# Patient Record
Sex: Female | Born: 1970
Health system: Southern US, Community
[De-identification: ages and names within clinical notes are randomized; demographics above are authoritative.]

## PROBLEM LIST (undated history)

## (undated) DIAGNOSIS — T7840XA Allergy, unspecified, initial encounter: Secondary | ICD-10-CM

## (undated) HISTORY — DX: Allergy, unspecified, initial encounter: T78.40XA

---

## 2001-05-29 ENCOUNTER — Ambulatory Visit (HOSPITAL_BASED_OUTPATIENT_CLINIC_OR_DEPARTMENT_OTHER): Admission: RE | Admit: 2001-05-29 | Discharge: 2001-05-29 | Payer: Self-pay | Admitting: Plastic Surgery

## 2002-03-14 ENCOUNTER — Ambulatory Visit (HOSPITAL_BASED_OUTPATIENT_CLINIC_OR_DEPARTMENT_OTHER): Admission: RE | Admit: 2002-03-14 | Discharge: 2002-03-14 | Payer: Self-pay | Admitting: Plastic Surgery

## 2004-05-11 ENCOUNTER — Encounter: Admission: RE | Admit: 2004-05-11 | Discharge: 2004-05-11 | Payer: Self-pay | Admitting: Family Medicine

## 2006-03-03 ENCOUNTER — Inpatient Hospital Stay (HOSPITAL_COMMUNITY): Admission: AD | Admit: 2006-03-03 | Discharge: 2006-03-07 | Payer: Self-pay | Admitting: Obstetrics and Gynecology

## 2006-03-08 ENCOUNTER — Encounter: Admission: RE | Admit: 2006-03-08 | Discharge: 2006-04-07 | Payer: Self-pay | Admitting: Obstetrics and Gynecology

## 2006-04-08 ENCOUNTER — Encounter: Admission: RE | Admit: 2006-04-08 | Discharge: 2006-05-07 | Payer: Self-pay | Admitting: Obstetrics and Gynecology

## 2006-05-08 ENCOUNTER — Encounter: Admission: RE | Admit: 2006-05-08 | Discharge: 2006-06-07 | Payer: Self-pay | Admitting: Obstetrics and Gynecology

## 2006-06-08 ENCOUNTER — Encounter: Admission: RE | Admit: 2006-06-08 | Discharge: 2006-07-08 | Payer: Self-pay | Admitting: Obstetrics and Gynecology

## 2006-07-09 ENCOUNTER — Encounter: Admission: RE | Admit: 2006-07-09 | Discharge: 2006-08-07 | Payer: Self-pay | Admitting: Obstetrics and Gynecology

## 2006-08-08 ENCOUNTER — Encounter: Admission: RE | Admit: 2006-08-08 | Discharge: 2006-09-07 | Payer: Self-pay | Admitting: Obstetrics and Gynecology

## 2006-09-08 ENCOUNTER — Encounter: Admission: RE | Admit: 2006-09-08 | Discharge: 2006-10-07 | Payer: Self-pay | Admitting: Obstetrics and Gynecology

## 2006-10-08 ENCOUNTER — Encounter: Admission: RE | Admit: 2006-10-08 | Discharge: 2006-11-07 | Payer: Self-pay | Admitting: Obstetrics and Gynecology

## 2006-11-08 ENCOUNTER — Encounter: Admission: RE | Admit: 2006-11-08 | Discharge: 2006-12-08 | Payer: Self-pay | Admitting: Obstetrics and Gynecology

## 2006-12-09 ENCOUNTER — Encounter: Admission: RE | Admit: 2006-12-09 | Discharge: 2006-12-21 | Payer: Self-pay | Admitting: Obstetrics and Gynecology

## 2009-10-30 DIAGNOSIS — J309 Allergic rhinitis, unspecified: Secondary | ICD-10-CM | POA: Insufficient documentation

## 2014-09-03 ENCOUNTER — Ambulatory Visit (INDEPENDENT_AMBULATORY_CARE_PROVIDER_SITE_OTHER): Payer: BC Managed Care – PPO | Admitting: Sports Medicine

## 2014-09-03 ENCOUNTER — Encounter: Payer: Self-pay | Admitting: Sports Medicine

## 2014-09-03 VITALS — BP 110/74 | Ht 66.5 in | Wt 140.0 lb

## 2014-09-03 DIAGNOSIS — M25561 Pain in right knee: Secondary | ICD-10-CM | POA: Diagnosis not present

## 2014-09-03 DIAGNOSIS — M25361 Other instability, right knee: Secondary | ICD-10-CM

## 2014-09-03 NOTE — Progress Notes (Signed)
  Brittney Baird - 43 y.o. female MRN [id number]  Date of birth: 20-Nov-1970  CC & HPI:  Brittney Baird presents for evaluation of: Right knee pain: patient reports medial sided knee pain and symptoms of instability that of been present for the past approximate 8 weeks. She reports playing tennis and feeling her knee give way. This is happening multiple times and was witnessed 1 time by a friend who reported appearing as though she had a hyperextension injury. She's had persistent medial sided knee pain and denies clicking, locking. She has had no significant prior trauma to this knee no prior surgeries. She reports overall strength is good but with activity especially with any type of valgus stress, such as with a lateral shuttle run she reports feeling is that the knee will give way. She is taking occasional NSAIDs, using a knee brace and is otherwise very active but no formal TherEx.she denies any significant effusion does report there are some stiffness following the initial incident.  ROS:  Per HPI.   HISTORY: Past Medical, Surgical, Social, and Family History Reviewed & Updated per EMR.  Pertinent Historical Findings include: Seasonal allergies otherwise healthy   OBJECTIVE:  VS:   HT:5' 6.5" (168.9 cm)   WT:140 lb (63.504 kg)  BMI:22.3          BP:110/74 mmHg  HR: bpm  TEMP: ( )  RESP:   PHYSICAL EXAM: GENERAL: Adult Caucasian  female. In no discomfort; no respiratory distress   PSYCH: alert and appropriate, good insight   NEURO: sensation is intact to light touch in bilateral lower extremities  VASCULAR: DP and PT pulses 2+/4.  No significant edema.    Right knee EXAM: Appearance: Overall normal-appearing, no significant effusion  Skin: No overlying erythema/ecchymosis.  Palpation: TTP over: posterior medial aspect of an joint line over the insertion of the hamstring  No TTP over: patellar tendon, medial or lateral joint lines  Strength & ROM: 5/5 Strength and full active ROM in: knee  flexion and extension. She does have slight amount of pain with resisted hamstring flexion at 30 knee bend over the medial aspect. Weakness in: right hip abduction and adduction at 5-/5on the right with a TFL predominant hip abduction recruitment pattern; 5+/5 on the left  Special Tests: Bilateral knees have excessive anterior translation with anterior drawer and Lachman's however there is a good endpoint on the left. No solid endpoint on the right. She is stable to varus strain, approximately 15 motion with valgus strain bilaterally with good endpoint, normal, nonpainful McMurray's, negative Apley's compression test.    ASSESSMENT: 1. Right knee pain   2. Instability of right knee joint    Given the mechanism of her injury, the location of her pain and symptoms of significant instability combined with a lack of a solid endpoint with Lachman's I have concerns for an ACL insufficient knee.  PLAN: See problem based charting & AVS for additional documentation. - X-ray to eval bony anatomy - MRI to eval for ACL and posterior medial meniscal pathology > I'll plan to call her after the MRI to discuss options at that time including potential surgical intervention versus conservative therapy with physical therapy if MRI is benign.

## 2014-09-04 ENCOUNTER — Ambulatory Visit
Admission: RE | Admit: 2014-09-04 | Discharge: 2014-09-04 | Disposition: A | Discharge status: Home / Self Care | Payer: BC Managed Care – PPO | Source: Ambulatory Visit | Attending: Sports Medicine | Admitting: Sports Medicine

## 2014-09-04 DIAGNOSIS — M25561 Pain in right knee: Secondary | ICD-10-CM

## 2014-09-04 DIAGNOSIS — S83519A Sprain of anterior cruciate ligament of unspecified knee, initial encounter: Secondary | ICD-10-CM | POA: Insufficient documentation

## 2014-09-04 NOTE — Patient Instructions (Signed)
Prior auth for MRI is [id number]

## 2014-09-11 ENCOUNTER — Ambulatory Visit
Admission: RE | Admit: 2014-09-11 | Discharge: 2014-09-11 | Disposition: A | Discharge status: Home / Self Care | Payer: BC Managed Care – PPO | Source: Ambulatory Visit | Attending: Sports Medicine | Admitting: Sports Medicine

## 2014-09-11 DIAGNOSIS — M25561 Pain in right knee: Secondary | ICD-10-CM

## 2014-09-12 ENCOUNTER — Telehealth: Payer: Self-pay | Admitting: Sports Medicine

## 2014-09-12 DIAGNOSIS — S83511D Sprain of anterior cruciate ligament of right knee, subsequent encounter: Secondary | ICD-10-CM

## 2014-09-12 NOTE — Assessment & Plan Note (Signed)
Called and discussed results of MRI with the patient regarding anterior cruciate ligament tear. Patient would like to discuss options with her husband but is leaning towards reconstruction. She will try to call and schedule appointment herself and inform us of who she would like to see. If she would like for [place] to put in a referral I discussed having her see Dr. Alphonzo Severance at [place] but we are happy to send her paperwork and information to wherever she would prefer.

## 2014-09-12 NOTE — Telephone Encounter (Signed)
See problem note

## 2014-09-17 ENCOUNTER — Ambulatory Visit: Payer: BC Managed Care – PPO | Admitting: Sports Medicine

## 2014-10-08 ENCOUNTER — Ambulatory Visit: Payer: BC Managed Care – PPO | Admitting: Sports Medicine

## 2015-04-23 ENCOUNTER — Ambulatory Visit (INDEPENDENT_AMBULATORY_CARE_PROVIDER_SITE_OTHER): Payer: BLUE CROSS/BLUE SHIELD | Admitting: Physician Assistant

## 2015-04-23 VITALS — BP 104/60 | HR 57 | Temp 98.2°F | Resp 20 | Ht 67.0 in | Wt 152.0 lb

## 2015-04-23 DIAGNOSIS — H9202 Otalgia, left ear: Secondary | ICD-10-CM

## 2015-04-23 DIAGNOSIS — M2662 Arthralgia of temporomandibular joint: Secondary | ICD-10-CM | POA: Diagnosis not present

## 2015-04-23 DIAGNOSIS — H6593 Unspecified nonsuppurative otitis media, bilateral: Secondary | ICD-10-CM

## 2015-04-23 DIAGNOSIS — M26629 Arthralgia of temporomandibular joint, unspecified side: Secondary | ICD-10-CM

## 2015-04-23 NOTE — Patient Instructions (Signed)
Switch to zyrtec-D to see if that helps. Take ibuprofen 800 mg three times a day for next 3-4 days. Eat softer foods. Ice your jaw a few times a day. If your symptoms are not improving in 2-3 weeks, return.  Temporomandibular Problems  Temporomandibular joint (TMJ) dysfunction means there are problems with the joint between your jaw and your skull. This is a joint lined by cartilage like other joints in your body but also has a small disc in the joint which keeps the bones from rubbing on each other. These joints are like other joints and can get inflamed (sore) from arthritis and other problems. When this joint gets sore, it can cause headaches and pain in the jaw and the face. CAUSES  Usually the arthritic types of problems are caused by soreness in the joint. Soreness in the joint can also be caused by overuse. This may come from grinding your teeth. It may also come from mis-alignment in the joint. DIAGNOSIS Diagnosis of this condition can often be made by history and exam. Sometimes your caregiver may need X-rays or an MRI scan to determine the exact cause. It may be necessary to see your dentist to determine if your teeth and jaws are lined up correctly. TREATMENT  Most of the time this problem is not serious; however, sometimes it can persist (become chronic). When this happens medications that will cut down on inflammation (soreness) help. Sometimes a shot of cortisone into the joint will be helpful. If your teeth are not aligned it may help for your dentist to make a splint for your mouth that can help this problem. If no physical problems can be found, the problem may come from tension. If tension is found to be the cause, biofeedback or relaxation techniques may be helpful. HOME CARE INSTRUCTIONS   Later in the day, applications of ice packs may be helpful. Ice can be used in a plastic bag with a towel around it to prevent frostbite to skin. This may be used about every 2 hours for 20 to  30 minutes, as needed while awake, or as directed by your caregiver.  Only take over-the-counter or prescription medicines for pain, discomfort, or fever as directed by your caregiver.  If physical therapy was prescribed, follow your caregiver's directions.  Wear mouth appliances as directed if they were given. Document Released: 07/05/2001 Document Revised: 01/02/2012 Document Reviewed: 10/12/2008 [facility] Patient Information 2015 [place], [place]. This information is not intended to replace advice given to you by your health care provider. Make sure you discuss any questions you have with your health care provider.

## 2015-04-23 NOTE — Progress Notes (Signed)
[facility] [address] [place] [address] [phone]  Date:  04/23/2015   Name:  Brittney Baird   DOB:  Dec 16, 1970   MRN:  [id number]  PCP:  No primary care provider on file.    Chief Complaint: Ear Pain   History of Present Illness:  This is a 44 y.o. female with PMH allergic rhinitis who is presenting with left ear pain x 1 day. States her hearing feels mildly muffled. Pain described as a dull ache. Tender with pressing in front of her ear and pulling on her ear. Some pain with chewing. No drainage from the ear. Hasn't been swimming recently. States she cannot recall what is feels like to get an ear infection as it has been quite some time since last infection. She has year round allergic rhinitis which she takes allegra for. She will occasionally take zyrtec-D if she is having sinus problems. She states she woke with a bad sinus headache 2 nights ago but otherwise no problems. Denies sore throat, nasal congestion, cough, fever, chills. Pt does not have a history of TMJ dysfunction. No popping or clicking with opening mouth.  Review of Systems:  Review of Systems See HPI  Patient Active Problem List   Diagnosis Date Noted  . Anterior cruciate ligament tear 09/04/2014  . Allergic rhinitis 10/30/2009    Prior to Admission medications   Medication Sig Start Date End Date Taking? Authorizing Provider  cetirizine-pseudoephedrine (ZYRTEC-D) 5-120 MG per tablet Take by mouth.   Yes Historical Provider, MD  fexofenadine (ALLEGRA) 180 MG tablet Take 180 mg by mouth.   Yes Historical Provider, MD  fluticasone (FLONASE) 50 MCG/ACT nasal spray USE 2 SPRAYS IN EACH NOSTRIL AT BEDTIME. 02/04/13  Yes Historical Provider, MD  norethindrone-ethinyl estradiol (JUNEL FE,GILDESS FE,LOESTRIN FE) 1-20 MG-MCG tablet Take 1 tablet by mouth.   Yes Historical Provider, MD    Allergies  Allergen Reactions  . Codeine     nausea    Past Surgical History  Procedure  Laterality Date  . Cesarean section      History  Substance Use Topics  . Smoking status: Never Smoker   . Smokeless tobacco: Never Used  . Alcohol Use: No    Family History  Problem Relation Age of Onset  . Cancer Mother   . Diabetes Mother   . Hyperlipidemia Mother   . Cancer Father     Medication list has been reviewed and updated.  Physical Examination:  Physical Exam  Constitutional: She is oriented to person, place, and time. She appears well-developed and well-nourished. No distress.  HENT:  Head: Normocephalic and atraumatic.  Right Ear: Hearing, external ear and ear canal normal. A middle ear effusion is present.  Left Ear: Hearing, external ear and ear canal normal. A middle ear effusion is present.  Nose: Nose normal.  Mouth/Throat: Uvula is midline and mucous membranes are normal. Posterior oropharyngeal erythema (mild) present. No oropharyngeal exudate or posterior oropharyngeal edema.  No mastoid tenderness Tender in temporomandibular joint. No popping/clicking palpated. No pain with manipulation of auricle or tragus  Eyes: Conjunctivae and lids are normal. Right eye exhibits no discharge. Left eye exhibits no discharge. No scleral icterus.  Cardiovascular: Normal rate, regular rhythm and normal pulses.   Pulmonary/Chest: Effort normal and breath sounds normal. No respiratory distress.  Musculoskeletal: Normal range of motion.  Lymphadenopathy:       Head (right side): No submental, no submandibular, no tonsillar, no preauricular and no  posterior auricular adenopathy present.       Head (left side): No submental, no submandibular, no tonsillar, no preauricular and no posterior auricular adenopathy present.    She has no cervical adenopathy.  Neurological: She is alert and oriented to person, place, and time.  Skin: Skin is warm, dry and intact. No lesion and no rash noted.  Psychiatric: She has a normal mood and affect. Her speech is normal and behavior is  normal. Thought content normal.   BP 104/60 mmHg  Pulse 57  Temp(Src) 98.2 F (36.8 C) (Oral)  Resp 20  Ht 5\' 7"  (1.702 m)  Wt 152 lb (68.947 kg)  BMI 23.80 kg/m2  SpO2 98%  LMP 04/06/2015 (Approximate)  Assessment and Plan:  1. Middle ear effusion, bilateral 2. Otalgia, left 3. Temporomandibular joint (TMJ) pain Pain d/t TMJ dysfunction vs eustachian tube dysfunction. She has bilateral middle ear effusions. Tender over TMJ and has been having pain with chewing. Advised she switch to zyrtec-D instead of allergra to help with middle ear effusion. Ibuprofen 800 mg TID for pain. Apply ice to jaw. Eat a softer diet until pain resolves. Return in 2-3 weeks if symptoms are not improving.   Benjaman Pott Drenda Freeze, MHS [facility]  04/23/2015

## 2016-08-10 DIAGNOSIS — L814 Other melanin hyperpigmentation: Secondary | ICD-10-CM | POA: Diagnosis not present

## 2016-08-10 DIAGNOSIS — Z86018 Personal history of other benign neoplasm: Secondary | ICD-10-CM | POA: Diagnosis not present

## 2016-08-10 DIAGNOSIS — D1800 Hemangioma unspecified site: Secondary | ICD-10-CM | POA: Diagnosis not present

## 2016-08-10 DIAGNOSIS — Z23 Encounter for immunization: Secondary | ICD-10-CM | POA: Diagnosis not present

## 2016-08-10 DIAGNOSIS — D225 Melanocytic nevi of trunk: Secondary | ICD-10-CM | POA: Diagnosis not present

## 2016-12-07 DIAGNOSIS — Z1231 Encounter for screening mammogram for malignant neoplasm of breast: Secondary | ICD-10-CM | POA: Diagnosis not present

## 2016-12-07 DIAGNOSIS — Z01419 Encounter for gynecological examination (general) (routine) without abnormal findings: Secondary | ICD-10-CM | POA: Diagnosis not present

## 2016-12-07 DIAGNOSIS — Z6823 Body mass index (BMI) 23.0-23.9, adult: Secondary | ICD-10-CM | POA: Diagnosis not present

## 2017-04-19 DIAGNOSIS — H5213 Myopia, bilateral: Secondary | ICD-10-CM | POA: Diagnosis not present

## 2017-04-19 DIAGNOSIS — H524 Presbyopia: Secondary | ICD-10-CM | POA: Diagnosis not present

## 2017-04-19 DIAGNOSIS — H52202 Unspecified astigmatism, left eye: Secondary | ICD-10-CM | POA: Diagnosis not present

## 2017-05-23 DIAGNOSIS — Z800 Family history of malignant neoplasm of digestive organs: Secondary | ICD-10-CM | POA: Diagnosis not present

## 2017-05-26 ENCOUNTER — Ambulatory Visit (INDEPENDENT_AMBULATORY_CARE_PROVIDER_SITE_OTHER): Payer: BLUE CROSS/BLUE SHIELD | Admitting: Gastroenterology

## 2017-05-26 ENCOUNTER — Encounter: Payer: Self-pay | Admitting: Gastroenterology

## 2017-05-26 VITALS — BP 90/60 | HR 68 | Ht 66.0 in | Wt 147.0 lb

## 2017-05-26 DIAGNOSIS — Z800 Family history of malignant neoplasm of digestive organs: Secondary | ICD-10-CM | POA: Diagnosis not present

## 2017-05-26 MED ORDER — NA SULFATE-K SULFATE-MG SULF 17.5-3.13-1.6 GM/177ML PO SOLN: 1.0000 | Freq: Once | ORAL | 0 refills | Status: AC

## 2017-05-26 NOTE — Progress Notes (Signed)
HPI: This is a   very pleasant 46 year old woman whom I am meeting for the first time today  who was referred to me by Fanny Bien, MD  to evaluate  family history colon cancer .    Chief complaint is family history of colon cancer  Her mother had colon cancer at age 68.  Surgery only. Survived and is alive today but in generally poor health.  No other FH of colon cancer.  She has minor constipation, until pregnancy;  Takes flaxseed  And on that regimen she has pretty irregular bowel habits  Very rare minor rectal bleeding.  Overall stable weight.     Review of systems: Pertinent positive and negative review of systems were noted in the above HPI section. All other review negative.   Past Medical History:  Diagnosis Date  . Allergy     Past Surgical History:  Procedure Laterality Date  . CESAREAN SECTION      Current Outpatient Prescriptions  Medication Sig Dispense Refill  . fexofenadine (ALLEGRA) 180 MG tablet Take 180 mg by mouth.    . Flaxseed, Linseed, (FLAX SEED OIL PO) Take 1 tablet by mouth daily.    . Misc Natural Products (GLUCOSAMINE CHOND MSM FORMULA PO) Take 1 tablet by mouth daily.    . Multiple Vitamins-Minerals (WOMENS MULTIVITAMIN PO) Take 1 tablet by mouth daily.    . norethindrone-ethinyl estradiol (JUNEL FE,GILDESS FE,LOESTRIN FE) 1-20 MG-MCG tablet Take 1 tablet by mouth.    . TURMERIC PO Take 1 tablet by mouth daily.     No current facility-administered medications for this visit.     Allergies as of 05/26/2017 - Review Complete 05/26/2017  Allergen Reaction Noted  . Codeine  04/23/2015    Family History  Problem Relation Age of Onset  . Cancer Mother   . Diabetes Mother   . Hyperlipidemia Mother   . Cancer Father     Social History   Social History  . Marital status: Married    Spouse name: N/A  . Number of children: N/A  . Years of education: N/A   Occupational History  . Not on file.   Social History Main Topics  .  Smoking status: Never Smoker  . Smokeless tobacco: Never Used  . Alcohol use No  . Drug use: No  . Sexual activity: Not on file   Other Topics Concern  . Not on file   Social History Narrative  . No narrative on file     Physical Exam: Ht 5\' 6"  (1.676 m) Comment: height measured without shoes  Wt 147 lb (66.7 kg)   BMI 23.73 kg/m  Constitutional: generally well-appearing Psychiatric: alert and oriented x3 Eyes: extraocular movements intact Mouth: oral pharynx moist, no lesions Neck: supple no lymphadenopathy Cardiovascular: heart regular rate and rhythm Lungs: clear to auscultation bilaterally Abdomen: soft, nontender, nondistended, no obvious ascites, no peritoneal signs, normal bowel sounds Extremities: no lower extremity edema bilaterally Skin: no lesions on visible extremities   Assessment and plan: 46 y.o. female with  family history of colon cancer, her mother had colon cancer at the age of 2  She is at elevated risk for colon cancer given her mother's history. I recommended we proceed with colonoscopy at her soonest convenience. I see no reason for any further blood tests or imaging studies prior to then.    Please see the "Patient Instructions" section for addition details about the plan.   Owens Loffler, MD [facility] 05/26/2017, 3:15 PM  Cc: Fanny Bien, MD

## 2017-05-26 NOTE — Patient Instructions (Addendum)
You will be set up for a colonoscopy for FH of colon cancer (mother at age 47)  Normal BMI (Body Mass Index- based on height and weight) is between 19 and 25. Your BMI today is Body mass index is 23.73 kg/m. Brittney Baird Please consider follow up  regarding your BMI with your Primary Care Provider.

## 2017-06-19 DIAGNOSIS — Z1329 Encounter for screening for other suspected endocrine disorder: Secondary | ICD-10-CM | POA: Diagnosis not present

## 2017-06-19 DIAGNOSIS — Z13220 Encounter for screening for lipoid disorders: Secondary | ICD-10-CM | POA: Diagnosis not present

## 2017-06-19 DIAGNOSIS — Z114 Encounter for screening for human immunodeficiency virus [HIV]: Secondary | ICD-10-CM | POA: Diagnosis not present

## 2017-06-19 DIAGNOSIS — Z0000 Encounter for general adult medical examination without abnormal findings: Secondary | ICD-10-CM | POA: Diagnosis not present

## 2017-06-28 DIAGNOSIS — Z0000 Encounter for general adult medical examination without abnormal findings: Secondary | ICD-10-CM | POA: Diagnosis not present

## 2017-06-28 DIAGNOSIS — Z6822 Body mass index (BMI) 22.0-22.9, adult: Secondary | ICD-10-CM | POA: Diagnosis not present

## 2017-07-24 ENCOUNTER — Encounter: Payer: Self-pay | Admitting: Gastroenterology

## 2017-07-24 ENCOUNTER — Ambulatory Visit (AMBULATORY_SURGERY_CENTER): Payer: BLUE CROSS/BLUE SHIELD | Admitting: Gastroenterology

## 2017-07-24 VITALS — BP 122/77 | HR 63 | Temp 97.8°F | Resp 10 | Ht 66.0 in | Wt 147.0 lb

## 2017-07-24 DIAGNOSIS — Z800 Family history of malignant neoplasm of digestive organs: Secondary | ICD-10-CM | POA: Diagnosis not present

## 2017-07-24 DIAGNOSIS — Z1212 Encounter for screening for malignant neoplasm of rectum: Secondary | ICD-10-CM

## 2017-07-24 DIAGNOSIS — Z1211 Encounter for screening for malignant neoplasm of colon: Secondary | ICD-10-CM | POA: Diagnosis not present

## 2017-07-24 MED ORDER — SODIUM CHLORIDE 0.9 % IV SOLN: 500.0000 mL | INTRAVENOUS | Status: DC

## 2017-07-24 NOTE — Progress Notes (Signed)
Report to PACU, RN, vss, BBS= Clear.  

## 2017-07-24 NOTE — Op Note (Signed)
[facility] Patient Name: Brittney Baird Procedure Date: 07/24/2017 1:02 PM MRN: [id number] Endoscopist: Milus Banister , MD Age: 46 Referring MD:  Date of Birth: August 23, 1971 Gender: Female Account #: [id number] Procedure:                Colonoscopy Indications:              Screening in patient at increased risk: Family                            history of 1st-degree relative with colorectal                            cancer before age 70 years (mother at age 4) Medicines:                Monitored Anesthesia Care Procedure:                Pre-Anesthesia Assessment:                           - Prior to the procedure, a History and Physical                            was performed, and patient medications and                            allergies were reviewed. The patient's tolerance of                            previous anesthesia was also reviewed. The risks                            and benefits of the procedure and the sedation                            options and risks were discussed with the patient.                            All questions were answered, and informed consent                            was obtained. Prior Anticoagulants: The patient has                            taken no previous anticoagulant or antiplatelet                            agents. ASA Grade Assessment: II - A patient with                            mild systemic disease. After reviewing the risks                            and benefits, the patient was deemed in  satisfactory condition to undergo the procedure.                           After obtaining informed consent, the colonoscope                            was passed under direct vision. Throughout the                            procedure, the patient's blood pressure, pulse, and                            oxygen saturations were monitored continuously. The                            Model CF-HQ190L  [id number]) scope was introduced                            through the anus and advanced to the the cecum,                            identified by appendiceal orifice and ileocecal                            valve. The colonoscopy was performed without                            difficulty. The patient tolerated the procedure                            well. The quality of the bowel preparation was                            excellent. The ileocecal valve, appendiceal                            orifice, and rectum were photographed. Scope In: 1:14:19 PM Scope Out: 1:24:05 PM Scope Withdrawal Time: 0 hours 6 minutes 1 second  Total Procedure Duration: 0 hours 9 minutes 46 seconds  Findings:                 The entire examined colon appeared normal on direct                            and retroflexion views. Complications:            No immediate complications. Estimated blood loss:                            None. Estimated Blood Loss:     Estimated blood loss: none. Impression:               - The entire examined colon is normal on direct and                            retroflexion  views.                           - No polyps or cancers. Recommendation:           - Patient has a contact number available for                            emergencies. The signs and symptoms of potential                            delayed complications were discussed with the                            patient. Return to normal activities tomorrow.                            Written discharge instructions were provided to the                            patient.                           - Resume previous diet.                           - Continue present medications.                           - Repeat colonoscopy in 5 years for screening                            purposes. Milus Banister, MD 07/24/2017 1:25:46 PM This report has been signed electronically.

## 2017-07-24 NOTE — Patient Instructions (Signed)
YOU HAD AN ENDOSCOPIC PROCEDURE TODAY AT THE [facility]:   Refer to the procedure report that was given to you for any specific questions about what was found during the examination.  If the procedure report does not answer your questions, please call your gastroenterologist to clarify.  If you requested that your care partner not be given the details of your procedure findings, then the procedure report has been included in a sealed envelope for you to review at your convenience later.  YOU SHOULD EXPECT: Some feelings of bloating in the abdomen. Passage of more gas than usual.  Walking can help get rid of the air that was put into your GI tract during the procedure and reduce the bloating. If you had a lower endoscopy (such as a colonoscopy or flexible sigmoidoscopy) you may notice spotting of blood in your stool or on the toilet paper. If you underwent a bowel prep for your procedure, you may not have a normal bowel movement for a few days.  Please Note:  You might notice some irritation and congestion in your nose or some drainage.  This is from the oxygen used during your procedure.  There is no need for concern and it should clear up in a day or so.  SYMPTOMS TO REPORT IMMEDIATELY:   Following lower endoscopy (colonoscopy or flexible sigmoidoscopy):  Excessive amounts of blood in the stool  Significant tenderness or worsening of abdominal pains  Swelling of the abdomen that is new, acute  Fever of 100F or higher   For urgent or emergent issues, a gastroenterologist can be reached at any hour by calling [phone].   DIET:  We do recommend a small meal at first, but then you may proceed to your regular diet.  Drink plenty of fluids but you should avoid alcoholic beverages for 24 hours.  ACTIVITY:  You should plan to take it easy for the rest of today and you should NOT DRIVE or use heavy machinery until tomorrow (because of the sedation medicines used during the test).     FOLLOW UP: Our staff will call the number listed on your records the next business day following your procedure to check on you and address any questions or concerns that you may have regarding the information given to you following your procedure. If we do not reach you, we will leave a message.  However, if you are feeling well and you are not experiencing any problems, there is no need to return our call.  We will assume that you have returned to your regular daily activities without incident.  If any biopsies were taken you will be contacted by phone or by letter within the next 1-3 weeks.  Please call us at [phone] if you have not heard about the biopsies in 3 weeks.    SIGNATURES/CONFIDENTIALITY: You and/or your care partner have signed paperwork which will be entered into your electronic medical record.  These signatures attest to the fact that that the information above on your After Visit Summary has been reviewed and is understood.  Full responsibility of the confidentiality of this discharge information lies with you and/or your care-partner.   Resume medications.  

## 2017-07-25 ENCOUNTER — Telehealth: Payer: Self-pay

## 2017-07-25 NOTE — Telephone Encounter (Signed)
  Follow up Call-  Call back number 07/24/2017  Post procedure Call Back phone  # [phone]  Permission to leave phone message Yes  Some recent data might be hidden     Patient questions:  Do you have a fever, pain , or abdominal swelling? No. Pain Score  0 *  Have you tolerated food without any problems? Yes.    Have you been able to return to your normal activities? Yes.    Do you have any questions about your discharge instructions: Diet   No. Medications  No. Follow up visit  No.  Do you have questions or concerns about your Care? No.  Actions: * If pain score is 4 or above: No action needed, pain <4.

## 2017-07-25 NOTE — Telephone Encounter (Signed)
Name identifier, left a message. 

## 2017-08-15 DIAGNOSIS — D225 Melanocytic nevi of trunk: Secondary | ICD-10-CM | POA: Diagnosis not present

## 2017-08-15 DIAGNOSIS — L814 Other melanin hyperpigmentation: Secondary | ICD-10-CM | POA: Diagnosis not present

## 2017-08-15 DIAGNOSIS — D1800 Hemangioma unspecified site: Secondary | ICD-10-CM | POA: Diagnosis not present

## 2017-08-15 DIAGNOSIS — Z86018 Personal history of other benign neoplasm: Secondary | ICD-10-CM | POA: Diagnosis not present

## 2017-08-26 DIAGNOSIS — J019 Acute sinusitis, unspecified: Secondary | ICD-10-CM | POA: Diagnosis not present

## 2017-08-28 DIAGNOSIS — J069 Acute upper respiratory infection, unspecified: Secondary | ICD-10-CM | POA: Diagnosis not present

## 2017-08-28 DIAGNOSIS — J040 Acute laryngitis: Secondary | ICD-10-CM | POA: Diagnosis not present

## 2017-08-28 DIAGNOSIS — J029 Acute pharyngitis, unspecified: Secondary | ICD-10-CM | POA: Diagnosis not present

## 2017-09-18 DIAGNOSIS — Z23 Encounter for immunization: Secondary | ICD-10-CM | POA: Diagnosis not present

## 2017-12-11 DIAGNOSIS — S83511D Sprain of anterior cruciate ligament of right knee, subsequent encounter: Secondary | ICD-10-CM | POA: Diagnosis not present

## 2017-12-13 DIAGNOSIS — Z6823 Body mass index (BMI) 23.0-23.9, adult: Secondary | ICD-10-CM | POA: Diagnosis not present

## 2017-12-13 DIAGNOSIS — Z1231 Encounter for screening mammogram for malignant neoplasm of breast: Secondary | ICD-10-CM | POA: Diagnosis not present

## 2017-12-13 DIAGNOSIS — Z01419 Encounter for gynecological examination (general) (routine) without abnormal findings: Secondary | ICD-10-CM | POA: Diagnosis not present

## 2017-12-15 ENCOUNTER — Other Ambulatory Visit: Payer: Self-pay | Admitting: Obstetrics and Gynecology

## 2017-12-15 DIAGNOSIS — R928 Other abnormal and inconclusive findings on diagnostic imaging of breast: Secondary | ICD-10-CM

## 2017-12-20 ENCOUNTER — Ambulatory Visit: Payer: BLUE CROSS/BLUE SHIELD

## 2017-12-20 ENCOUNTER — Ambulatory Visit
Admission: RE | Admit: 2017-12-20 | Discharge: 2017-12-20 | Disposition: A | Discharge status: Home / Self Care | Payer: BLUE CROSS/BLUE SHIELD | Source: Ambulatory Visit | Attending: Obstetrics and Gynecology | Admitting: Obstetrics and Gynecology

## 2017-12-20 ENCOUNTER — Other Ambulatory Visit: Payer: Self-pay | Admitting: Obstetrics and Gynecology

## 2017-12-20 DIAGNOSIS — R922 Inconclusive mammogram: Secondary | ICD-10-CM | POA: Diagnosis not present

## 2017-12-20 DIAGNOSIS — R928 Other abnormal and inconclusive findings on diagnostic imaging of breast: Secondary | ICD-10-CM

## 2018-02-02 DIAGNOSIS — M25561 Pain in right knee: Secondary | ICD-10-CM | POA: Diagnosis not present

## 2018-02-24 DIAGNOSIS — M25562 Pain in left knee: Secondary | ICD-10-CM | POA: Diagnosis not present

## 2018-02-27 DIAGNOSIS — M25561 Pain in right knee: Secondary | ICD-10-CM | POA: Diagnosis not present

## 2018-02-28 DIAGNOSIS — M25561 Pain in right knee: Secondary | ICD-10-CM | POA: Diagnosis not present

## 2018-02-28 DIAGNOSIS — M2241 Chondromalacia patellae, right knee: Secondary | ICD-10-CM | POA: Diagnosis not present

## 2018-03-12 DIAGNOSIS — M25561 Pain in right knee: Secondary | ICD-10-CM | POA: Diagnosis not present

## 2018-03-12 DIAGNOSIS — M2241 Chondromalacia patellae, right knee: Secondary | ICD-10-CM | POA: Diagnosis not present

## 2018-03-21 DIAGNOSIS — M2241 Chondromalacia patellae, right knee: Secondary | ICD-10-CM | POA: Diagnosis not present

## 2018-03-21 DIAGNOSIS — M25561 Pain in right knee: Secondary | ICD-10-CM | POA: Diagnosis not present

## 2018-12-31 DIAGNOSIS — Z1231 Encounter for screening mammogram for malignant neoplasm of breast: Secondary | ICD-10-CM | POA: Diagnosis not present

## 2018-12-31 DIAGNOSIS — Z6822 Body mass index (BMI) 22.0-22.9, adult: Secondary | ICD-10-CM | POA: Diagnosis not present

## 2018-12-31 DIAGNOSIS — Z01419 Encounter for gynecological examination (general) (routine) without abnormal findings: Secondary | ICD-10-CM | POA: Diagnosis not present

## 2019-01-02 DIAGNOSIS — Z8041 Family history of malignant neoplasm of ovary: Secondary | ICD-10-CM | POA: Diagnosis not present

## 2019-01-02 DIAGNOSIS — Z800 Family history of malignant neoplasm of digestive organs: Secondary | ICD-10-CM | POA: Diagnosis not present

## 2019-01-02 DIAGNOSIS — Z808 Family history of malignant neoplasm of other organs or systems: Secondary | ICD-10-CM | POA: Diagnosis not present

## 2019-01-02 DIAGNOSIS — Z8042 Family history of malignant neoplasm of prostate: Secondary | ICD-10-CM | POA: Diagnosis not present

## 2019-02-13 DIAGNOSIS — Z809 Family history of malignant neoplasm, unspecified: Secondary | ICD-10-CM | POA: Diagnosis not present

## 2019-02-13 IMAGING — MG DIGITAL DIAGNOSTIC BILATERAL MAMMOGRAM WITH TOMO AND CAD
8 series · 9 of 24 positions shown · non-contrast
Comparison: Previous exam(s).

CLINICAL DATA: Screening recall for possible asymmetries in each
breast.

EXAM:
DIGITAL DIAGNOSTIC BILATERAL MAMMOGRAM WITH CAD AND TOMO

[R ML synth-2D]
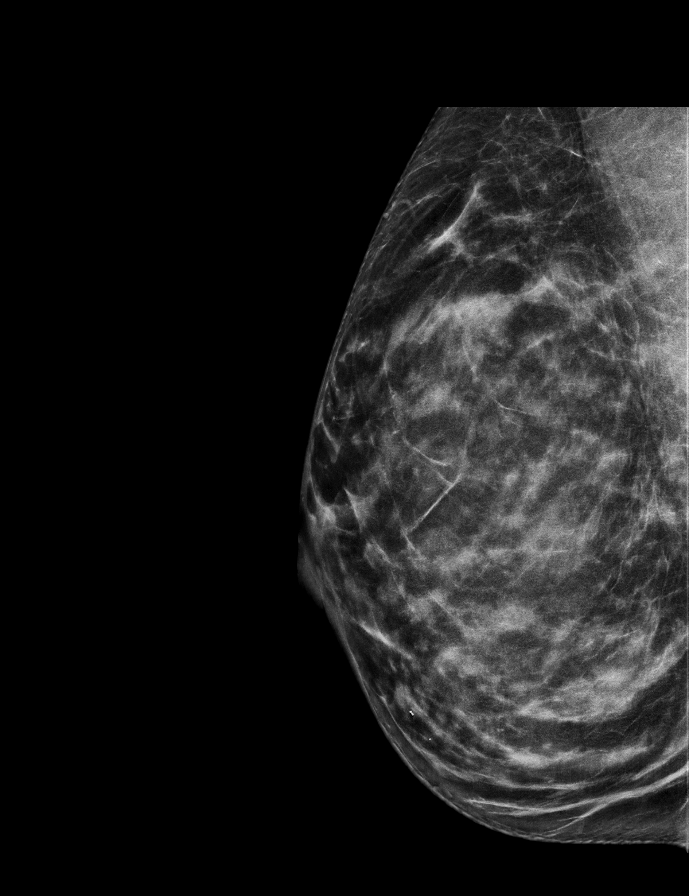

[L ML synth-2D]
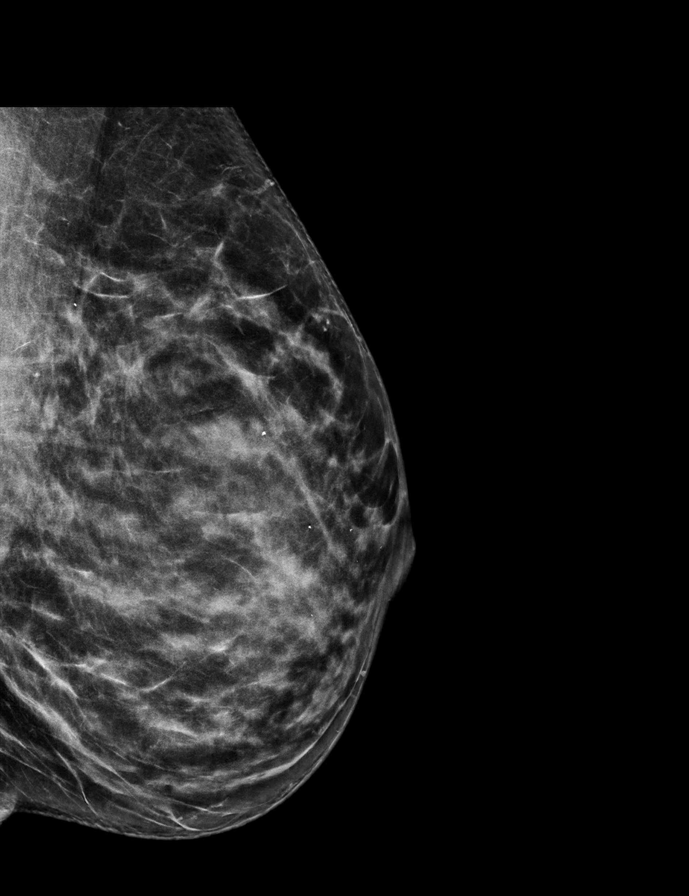

[L CC synth-2D]
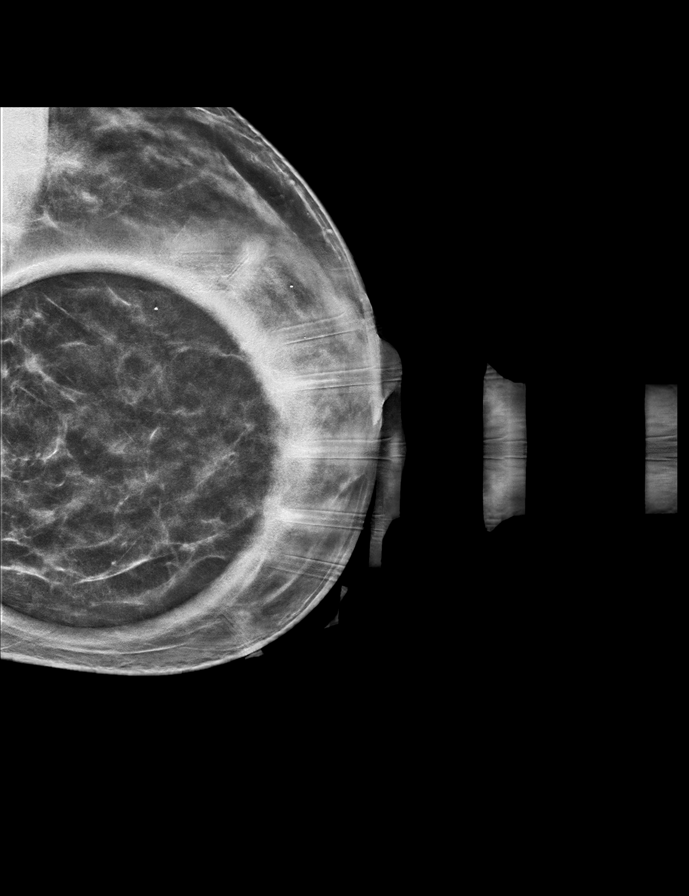

[R MLO synth-2D]
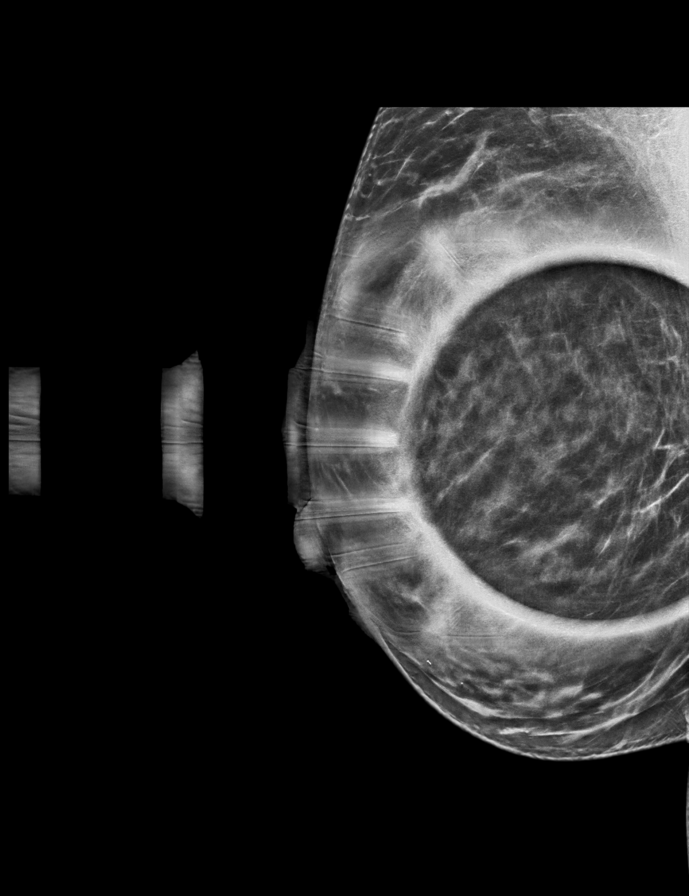

[R MLO tomo · 2 of 69 frames shown]
[frame 23/69]
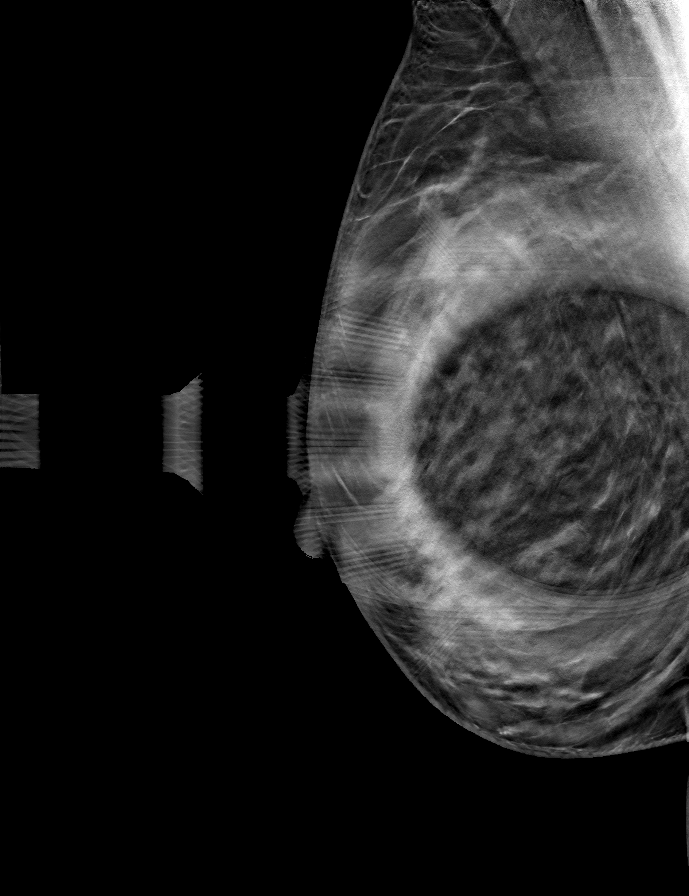
[frame 35/69]
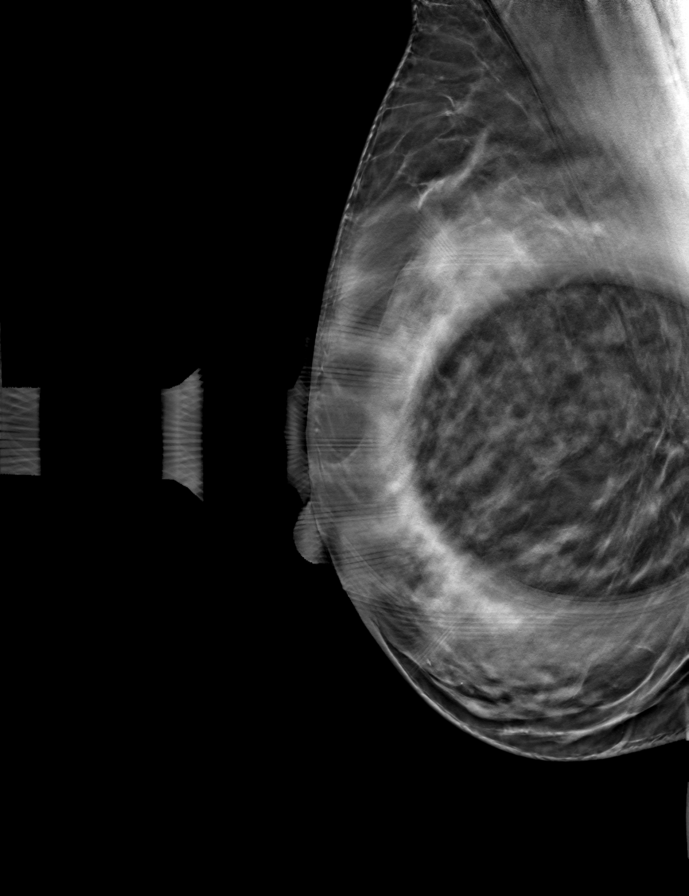

[L ML tomo · tomo slice 35/69.0]
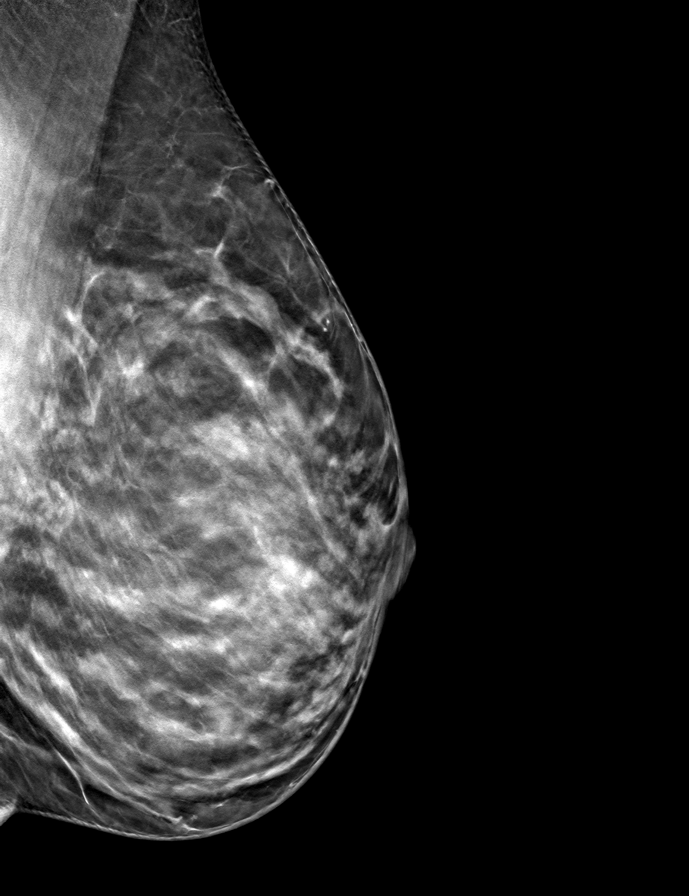

[R ML tomo · tomo slice 33/66.0]
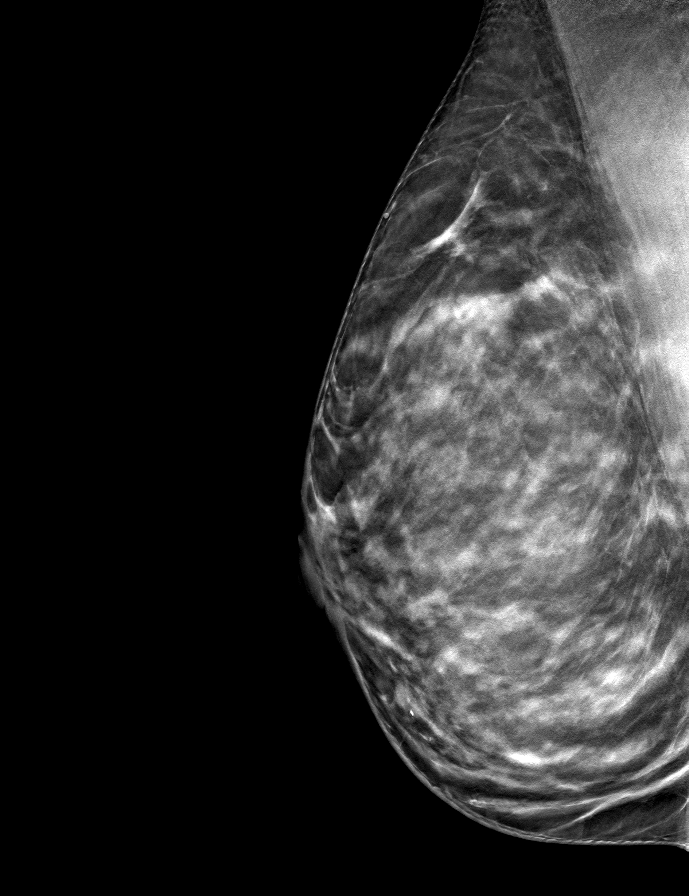

[L CC tomo · tomo slice 34/67.0]
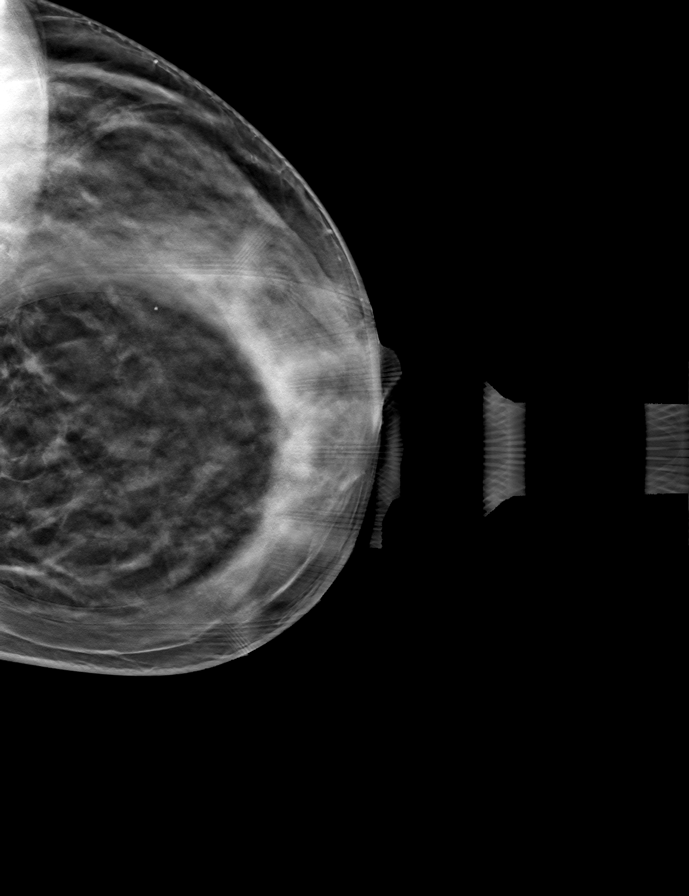

[9 of 24 positions shown; findings below may reference images not displayed]

ACR Breast Density Category c: The breast tissue is heterogeneously
dense, which may obscure small masses.
FINDINGS: The possible asymmetries, noted on the left CC view and on the right
MLO view on the current screening study, both disperses on the
diagnostic spot-compression images consistent with superimposed
fibroglandular tissue. No asymmetry is seen on the mL images. There
are no discrete masses. There are no areas of architectural
distortion and no suspicious calcifications.

Mammographic images were processed with CAD.
IMPRESSION: No evidence of breast malignancy.

RECOMMENDATION:
Screening mammogram in one year.(Code:[id number]-[id number])

I have discussed the findings and recommendations with the patient.
Results were also provided in writing at the conclusion of the
visit. If applicable, a reminder letter will be sent to the patient
regarding the next appointment.

BI-RADS CATEGORY  1: Negative.

## 2019-05-08 DIAGNOSIS — L821 Other seborrheic keratosis: Secondary | ICD-10-CM | POA: Diagnosis not present

## 2019-05-08 DIAGNOSIS — Z86018 Personal history of other benign neoplasm: Secondary | ICD-10-CM | POA: Diagnosis not present

## 2019-05-08 DIAGNOSIS — D225 Melanocytic nevi of trunk: Secondary | ICD-10-CM | POA: Diagnosis not present

## 2019-05-08 DIAGNOSIS — L814 Other melanin hyperpigmentation: Secondary | ICD-10-CM | POA: Diagnosis not present

## 2019-06-04 ENCOUNTER — Other Ambulatory Visit: Payer: Self-pay

## 2019-06-04 DIAGNOSIS — Z20822 Contact with and (suspected) exposure to covid-19: Secondary | ICD-10-CM

## 2019-06-05 LAB — NOVEL CORONAVIRUS, NAA: SARS-CoV-2, NAA: NOT DETECTED

## 2019-11-14 DIAGNOSIS — H52203 Unspecified astigmatism, bilateral: Secondary | ICD-10-CM | POA: Diagnosis not present

## 2019-11-14 DIAGNOSIS — H10413 Chronic giant papillary conjunctivitis, bilateral: Secondary | ICD-10-CM | POA: Diagnosis not present

## 2019-11-14 DIAGNOSIS — H5213 Myopia, bilateral: Secondary | ICD-10-CM | POA: Diagnosis not present

## 2019-12-02 ENCOUNTER — Other Ambulatory Visit: Payer: BLUE CROSS/BLUE SHIELD

## 2019-12-02 ENCOUNTER — Ambulatory Visit: Payer: BC Managed Care – PPO | Attending: Internal Medicine

## 2019-12-02 ENCOUNTER — Other Ambulatory Visit: Payer: BC Managed Care – PPO

## 2019-12-02 DIAGNOSIS — Z20822 Contact with and (suspected) exposure to covid-19: Secondary | ICD-10-CM

## 2019-12-03 LAB — NOVEL CORONAVIRUS, NAA: SARS-CoV-2, NAA: NOT DETECTED

## 2019-12-07 DIAGNOSIS — J3489 Other specified disorders of nose and nasal sinuses: Secondary | ICD-10-CM | POA: Diagnosis not present

## 2019-12-07 DIAGNOSIS — Z20828 Contact with and (suspected) exposure to other viral communicable diseases: Secondary | ICD-10-CM | POA: Diagnosis not present

## 2019-12-09 ENCOUNTER — Ambulatory Visit: Payer: BC Managed Care – PPO | Attending: Internal Medicine

## 2019-12-09 DIAGNOSIS — Z20822 Contact with and (suspected) exposure to covid-19: Secondary | ICD-10-CM

## 2019-12-10 LAB — NOVEL CORONAVIRUS, NAA: SARS-CoV-2, NAA: NOT DETECTED

## 2020-01-08 DIAGNOSIS — Z01419 Encounter for gynecological examination (general) (routine) without abnormal findings: Secondary | ICD-10-CM | POA: Diagnosis not present

## 2020-01-08 DIAGNOSIS — Z6822 Body mass index (BMI) 22.0-22.9, adult: Secondary | ICD-10-CM | POA: Diagnosis not present

## 2020-01-08 DIAGNOSIS — Z1231 Encounter for screening mammogram for malignant neoplasm of breast: Secondary | ICD-10-CM | POA: Diagnosis not present

## 2020-02-24 DIAGNOSIS — Z131 Encounter for screening for diabetes mellitus: Secondary | ICD-10-CM | POA: Diagnosis not present

## 2020-02-24 DIAGNOSIS — Z13220 Encounter for screening for lipoid disorders: Secondary | ICD-10-CM | POA: Diagnosis not present

## 2020-02-24 DIAGNOSIS — Z1329 Encounter for screening for other suspected endocrine disorder: Secondary | ICD-10-CM | POA: Diagnosis not present

## 2020-02-24 DIAGNOSIS — Z130 Encounter for screening for diseases of the blood and blood-forming organs and certain disorders involving the immune mechanism: Secondary | ICD-10-CM | POA: Diagnosis not present

## 2020-02-24 DIAGNOSIS — Z0000 Encounter for general adult medical examination without abnormal findings: Secondary | ICD-10-CM | POA: Diagnosis not present

## 2020-03-16 ENCOUNTER — Ambulatory Visit: Payer: BC Managed Care – PPO | Attending: Internal Medicine

## 2020-03-16 DIAGNOSIS — Z20822 Contact with and (suspected) exposure to covid-19: Secondary | ICD-10-CM | POA: Diagnosis not present

## 2020-03-17 LAB — NOVEL CORONAVIRUS, NAA: SARS-CoV-2, NAA: NOT DETECTED

## 2020-03-17 LAB — SARS-COV-2, NAA 2 DAY TAT

## 2020-07-16 DIAGNOSIS — U071 COVID-19: Secondary | ICD-10-CM | POA: Diagnosis not present

## 2020-07-18 ENCOUNTER — Telehealth: Payer: Self-pay | Admitting: Family

## 2020-07-18 NOTE — Telephone Encounter (Signed)
I connected by phone with Brittney Baird on 07/18/2020 at 2:24 PM to discuss the potential use of a new treatment for mild to moderate COVID-19 viral infection in non-hospitalized patients.  This patient is a 49 y.o. female that does not meets the FDA criteria for Emergency Use Authorization of COVID monoclonal antibody casirivimab/imdevimab or bamlanivimab/eteseviamb.  Provided with phone number for post COVID care clinic if needed. She was understanding and very appreciative of the call.   Loel Dubonnet, NP 07/18/2020 2:24 PM

## 2020-07-23 ENCOUNTER — Other Ambulatory Visit: Payer: Self-pay

## 2020-07-23 ENCOUNTER — Ambulatory Visit (INDEPENDENT_AMBULATORY_CARE_PROVIDER_SITE_OTHER): Payer: BC Managed Care – PPO | Admitting: Nurse Practitioner

## 2020-07-23 ENCOUNTER — Ambulatory Visit (HOSPITAL_COMMUNITY)
Admission: RE | Admit: 2020-07-23 | Discharge: 2020-07-23 | Disposition: A | Discharge status: Home / Self Care | Payer: BC Managed Care – PPO | Source: Ambulatory Visit | Attending: Nurse Practitioner | Admitting: Nurse Practitioner

## 2020-07-23 VITALS — Ht 66.0 in

## 2020-07-23 DIAGNOSIS — U071 COVID-19: Secondary | ICD-10-CM | POA: Insufficient documentation

## 2020-07-23 DIAGNOSIS — R05 Cough: Secondary | ICD-10-CM | POA: Diagnosis not present

## 2020-07-23 MED ORDER — PREDNISONE 10 MG PO TABS: ORAL_TABLET | ORAL | 0 refills | Status: AC

## 2020-07-23 NOTE — Progress Notes (Signed)
@Patient  ID: Brittney Baird, female    DOB: 1971/09/25, 49 y.o.   MRN: [id number]  Chief Complaint  Patient presents with  . Covid Positive    Pos: 9/23 Sx: Cough , fatigue    Referring provider: Fanny Bien, MD   49 year old female with neck significant health history.  Diagnosed with Covid on 07/16/2020.  HPI  Patient presents today for post COVID care clinic visit.  Patient was diagnosed with Covid on 07/16/2020.  She states that she has been having cough and shortness of breath.  She does check her O2 sats at home and they have been stable.  She states that over the past couple days her cough has become productive of thick sputum. Denies f/c/s, n/v/d, hemoptysis, PND, chest pain or edema.       Allergies  Allergen Reactions  . Codeine     nausea     There is no immunization history on file for this patient.  Past Medical History:  Diagnosis Date  . Allergy     Tobacco History: Social History   Tobacco Use  Smoking Status Never Smoker  Smokeless Tobacco Never Used   Counseling given: Not Answered   Outpatient Encounter Medications as of 07/23/2020  Medication Sig  . fexofenadine (ALLEGRA) 180 MG tablet Take 180 mg by mouth.  . Flaxseed, Linseed, (FLAX SEED OIL PO) Take 1 tablet by mouth daily.  . Misc Natural Products (GLUCOSAMINE CHOND MSM FORMULA PO) Take 1 tablet by mouth daily.  . Multiple Vitamins-Minerals (WOMENS MULTIVITAMIN PO) Take 1 tablet by mouth daily.  . norethindrone-ethinyl estradiol (JUNEL FE,GILDESS FE,LOESTRIN FE) 1-20 MG-MCG tablet Take 1 tablet by mouth.  . TURMERIC PO Take 1 tablet by mouth daily.  . predniSONE (DELTASONE) 10 MG tablet Take 4 tabs for 2 days, then 3 tabs for 2 days, then 2 tabs for 2 days, then 1 tab for 2 days, then stop   No facility-administered encounter medications on file as of 07/23/2020.     Review of Systems  Review of Systems  Constitutional: Negative.  Negative for fever.  HENT: Negative.    Respiratory: Positive for cough and shortness of breath.   Cardiovascular: Negative.  Negative for chest pain, palpitations and leg swelling.  Gastrointestinal: Negative.   Allergic/Immunologic: Negative.   Neurological: Negative.   Psychiatric/Behavioral: Negative.        Physical Exam  Ht 5\' 6"  (1.676 m)   LMP 06/22/2020   BMI 23.73 kg/m   Wt Readings from Last 5 Encounters:  07/24/17 147 lb (66.7 kg)  05/26/17 147 lb (66.7 kg)  04/23/15 152 lb (68.9 kg)  09/03/14 140 lb (63.5 kg)     Physical Exam Vitals and nursing note reviewed.  Constitutional:      General: She is not in acute distress.    Appearance: She is well-developed.  Cardiovascular:     Rate and Rhythm: Normal rate and regular rhythm.  Pulmonary:     Effort: Pulmonary effort is normal.     Breath sounds: Normal breath sounds.  Musculoskeletal:     Right lower leg: No edema.     Left lower leg: No edema.  Neurological:     Mental Status: She is alert and oriented to person, place, and time.  Psychiatric:        Mood and Affect: Mood normal.        Behavior: Behavior normal.       Assessment & Plan:   COVID-19 Cough:  Stay well hydrated  Stay active  Deep breathing exercises  May start vitamin C 2,000 mg daily, vitamin D3 2,000 IU daily, Zinc 220 mg daily, and Quercetin 500 mg twice daily  May take tylenol or fever or pain  May take mucinex twice daily  Will order chest x ray   Follow up:  Follow up in 2 weeks or sooner if needed      Fenton Foy, NP 07/23/2020

## 2020-07-23 NOTE — Patient Instructions (Addendum)
Covid 19 Cough:   Stay well hydrated  Stay active  Deep breathing exercises  May start vitamin C 2,000 mg daily, vitamin D3 2,000 IU daily, Zinc 220 mg daily, and Quercetin 500 mg twice daily  May take tylenol or fever or pain  May take mucinex twice daily  Will order chest x ray   Follow up:  Follow up in 2 weeks or sooner if needed

## 2020-07-23 NOTE — Assessment & Plan Note (Signed)
Cough:   Stay well hydrated  Stay active  Deep breathing exercises  May start vitamin C 2,000 mg daily, vitamin D3 2,000 IU daily, Zinc 220 mg daily, and Quercetin 500 mg twice daily  May take tylenol or fever or pain  May take mucinex twice daily  Will order chest x ray   Follow up:  Follow up in 2 weeks or sooner if needed

## 2020-07-24 ENCOUNTER — Telehealth: Payer: Self-pay | Admitting: Nurse Practitioner

## 2020-07-24 NOTE — Telephone Encounter (Signed)
Per Verbal by Lazaro Arms, NP Xray is clear patient to continue course of Prednisone. Patient verbally understood. No additional questions.

## 2020-08-04 DIAGNOSIS — J0100 Acute maxillary sinusitis, unspecified: Secondary | ICD-10-CM | POA: Diagnosis not present

## 2020-09-03 DIAGNOSIS — D225 Melanocytic nevi of trunk: Secondary | ICD-10-CM | POA: Diagnosis not present

## 2020-09-03 DIAGNOSIS — Z86018 Personal history of other benign neoplasm: Secondary | ICD-10-CM | POA: Diagnosis not present

## 2020-09-03 DIAGNOSIS — D0339 Melanoma in situ of other parts of face: Secondary | ICD-10-CM | POA: Diagnosis not present

## 2020-09-03 DIAGNOSIS — L821 Other seborrheic keratosis: Secondary | ICD-10-CM | POA: Diagnosis not present

## 2020-09-03 DIAGNOSIS — D485 Neoplasm of uncertain behavior of skin: Secondary | ICD-10-CM | POA: Diagnosis not present

## 2020-09-03 DIAGNOSIS — L814 Other melanin hyperpigmentation: Secondary | ICD-10-CM | POA: Diagnosis not present

## 2020-09-21 DIAGNOSIS — D2339 Other benign neoplasm of skin of other parts of face: Secondary | ICD-10-CM | POA: Diagnosis not present

## 2020-09-21 DIAGNOSIS — D0330 Melanoma in situ of unspecified part of face: Secondary | ICD-10-CM | POA: Diagnosis not present

## 2020-09-28 DIAGNOSIS — D0330 Melanoma in situ of unspecified part of face: Secondary | ICD-10-CM | POA: Diagnosis not present

## 2020-11-02 DIAGNOSIS — D0339 Melanoma in situ of other parts of face: Secondary | ICD-10-CM | POA: Diagnosis not present

## 2020-11-02 DIAGNOSIS — D2239 Melanocytic nevi of other parts of face: Secondary | ICD-10-CM | POA: Diagnosis not present

## 2020-12-29 DIAGNOSIS — D0330 Melanoma in situ of unspecified part of face: Secondary | ICD-10-CM | POA: Diagnosis not present

## 2021-01-20 DIAGNOSIS — Z6823 Body mass index (BMI) 23.0-23.9, adult: Secondary | ICD-10-CM | POA: Diagnosis not present

## 2021-01-20 DIAGNOSIS — Z1231 Encounter for screening mammogram for malignant neoplasm of breast: Secondary | ICD-10-CM | POA: Diagnosis not present

## 2021-01-20 DIAGNOSIS — Z01419 Encounter for gynecological examination (general) (routine) without abnormal findings: Secondary | ICD-10-CM | POA: Diagnosis not present

## 2021-01-25 DIAGNOSIS — M2012 Hallux valgus (acquired), left foot: Secondary | ICD-10-CM | POA: Diagnosis not present

## 2021-01-25 DIAGNOSIS — M2011 Hallux valgus (acquired), right foot: Secondary | ICD-10-CM | POA: Diagnosis not present

## 2021-01-25 DIAGNOSIS — M205X1 Other deformities of toe(s) (acquired), right foot: Secondary | ICD-10-CM | POA: Diagnosis not present

## 2021-01-25 DIAGNOSIS — S93145A Subluxation of metatarsophalangeal joint of left lesser toe(s), initial encounter: Secondary | ICD-10-CM | POA: Diagnosis not present

## 2021-02-26 DIAGNOSIS — J029 Acute pharyngitis, unspecified: Secondary | ICD-10-CM | POA: Diagnosis not present

## 2021-03-01 DIAGNOSIS — Z13220 Encounter for screening for lipoid disorders: Secondary | ICD-10-CM | POA: Diagnosis not present

## 2021-03-01 DIAGNOSIS — Z0000 Encounter for general adult medical examination without abnormal findings: Secondary | ICD-10-CM | POA: Diagnosis not present

## 2021-03-08 DIAGNOSIS — L814 Other melanin hyperpigmentation: Secondary | ICD-10-CM | POA: Diagnosis not present

## 2021-03-08 DIAGNOSIS — L821 Other seborrheic keratosis: Secondary | ICD-10-CM | POA: Diagnosis not present

## 2021-03-08 DIAGNOSIS — D225 Melanocytic nevi of trunk: Secondary | ICD-10-CM | POA: Diagnosis not present

## 2021-03-08 DIAGNOSIS — D485 Neoplasm of uncertain behavior of skin: Secondary | ICD-10-CM | POA: Diagnosis not present

## 2021-03-08 DIAGNOSIS — Z86018 Personal history of other benign neoplasm: Secondary | ICD-10-CM | POA: Diagnosis not present

## 2021-04-12 DIAGNOSIS — D3121 Benign neoplasm of right retina: Secondary | ICD-10-CM | POA: Diagnosis not present

## 2021-09-08 DIAGNOSIS — D2261 Melanocytic nevi of right upper limb, including shoulder: Secondary | ICD-10-CM | POA: Diagnosis not present

## 2021-09-08 DIAGNOSIS — L821 Other seborrheic keratosis: Secondary | ICD-10-CM | POA: Diagnosis not present

## 2021-09-08 DIAGNOSIS — D485 Neoplasm of uncertain behavior of skin: Secondary | ICD-10-CM | POA: Diagnosis not present

## 2021-09-08 DIAGNOSIS — D225 Melanocytic nevi of trunk: Secondary | ICD-10-CM | POA: Diagnosis not present

## 2021-09-08 DIAGNOSIS — Z86018 Personal history of other benign neoplasm: Secondary | ICD-10-CM | POA: Diagnosis not present

## 2021-09-08 DIAGNOSIS — L814 Other melanin hyperpigmentation: Secondary | ICD-10-CM | POA: Diagnosis not present

## 2021-09-14 DIAGNOSIS — R051 Acute cough: Secondary | ICD-10-CM | POA: Diagnosis not present

## 2021-09-14 DIAGNOSIS — Z20822 Contact with and (suspected) exposure to covid-19: Secondary | ICD-10-CM | POA: Diagnosis not present

## 2021-09-14 DIAGNOSIS — J069 Acute upper respiratory infection, unspecified: Secondary | ICD-10-CM | POA: Diagnosis not present

## 2021-09-14 DIAGNOSIS — J0100 Acute maxillary sinusitis, unspecified: Secondary | ICD-10-CM | POA: Diagnosis not present

## 2021-09-14 DIAGNOSIS — R5383 Other fatigue: Secondary | ICD-10-CM | POA: Diagnosis not present

## 2021-10-27 DIAGNOSIS — D3121 Benign neoplasm of right retina: Secondary | ICD-10-CM | POA: Diagnosis not present

## 2021-10-27 DIAGNOSIS — H04123 Dry eye syndrome of bilateral lacrimal glands: Secondary | ICD-10-CM | POA: Diagnosis not present

## 2021-11-17 DIAGNOSIS — D235 Other benign neoplasm of skin of trunk: Secondary | ICD-10-CM | POA: Diagnosis not present

## 2021-11-17 DIAGNOSIS — D225 Melanocytic nevi of trunk: Secondary | ICD-10-CM | POA: Diagnosis not present

## 2022-02-09 DIAGNOSIS — Z309 Encounter for contraceptive management, unspecified: Secondary | ICD-10-CM | POA: Diagnosis not present

## 2022-02-09 DIAGNOSIS — Z6823 Body mass index (BMI) 23.0-23.9, adult: Secondary | ICD-10-CM | POA: Diagnosis not present

## 2022-02-09 DIAGNOSIS — Z1151 Encounter for screening for human papillomavirus (HPV): Secondary | ICD-10-CM | POA: Diagnosis not present

## 2022-02-09 DIAGNOSIS — Z124 Encounter for screening for malignant neoplasm of cervix: Secondary | ICD-10-CM | POA: Diagnosis not present

## 2022-02-09 DIAGNOSIS — Z1231 Encounter for screening mammogram for malignant neoplasm of breast: Secondary | ICD-10-CM | POA: Diagnosis not present

## 2022-02-09 DIAGNOSIS — Z01419 Encounter for gynecological examination (general) (routine) without abnormal findings: Secondary | ICD-10-CM | POA: Diagnosis not present

## 2022-03-07 DIAGNOSIS — J069 Acute upper respiratory infection, unspecified: Secondary | ICD-10-CM | POA: Diagnosis not present

## 2022-03-07 DIAGNOSIS — H6982 Other specified disorders of Eustachian tube, left ear: Secondary | ICD-10-CM | POA: Diagnosis not present

## 2022-03-07 DIAGNOSIS — J029 Acute pharyngitis, unspecified: Secondary | ICD-10-CM | POA: Diagnosis not present

## 2022-03-09 DIAGNOSIS — L814 Other melanin hyperpigmentation: Secondary | ICD-10-CM | POA: Diagnosis not present

## 2022-03-09 DIAGNOSIS — L821 Other seborrheic keratosis: Secondary | ICD-10-CM | POA: Diagnosis not present

## 2022-03-09 DIAGNOSIS — L578 Other skin changes due to chronic exposure to nonionizing radiation: Secondary | ICD-10-CM | POA: Diagnosis not present

## 2022-03-15 DIAGNOSIS — Z0000 Encounter for general adult medical examination without abnormal findings: Secondary | ICD-10-CM | POA: Diagnosis not present

## 2022-03-15 DIAGNOSIS — Z13220 Encounter for screening for lipoid disorders: Secondary | ICD-10-CM | POA: Diagnosis not present

## 2022-07-04 DIAGNOSIS — H25043 Posterior subcapsular polar age-related cataract, bilateral: Secondary | ICD-10-CM | POA: Diagnosis not present

## 2022-07-04 DIAGNOSIS — H5213 Myopia, bilateral: Secondary | ICD-10-CM | POA: Diagnosis not present

## 2022-07-21 ENCOUNTER — Encounter: Payer: Self-pay | Admitting: Gastroenterology

## 2022-08-03 ENCOUNTER — Other Ambulatory Visit: Payer: Self-pay | Admitting: Allergy and Immunology

## 2022-08-03 ENCOUNTER — Ambulatory Visit
Admission: RE | Admit: 2022-08-03 | Discharge: 2022-08-03 | Disposition: A | Discharge status: Home / Self Care | Payer: BC Managed Care – PPO | Source: Ambulatory Visit | Attending: Allergy and Immunology | Admitting: Allergy and Immunology

## 2022-08-03 DIAGNOSIS — R058 Other specified cough: Secondary | ICD-10-CM

## 2022-08-03 DIAGNOSIS — J45998 Other asthma: Secondary | ICD-10-CM | POA: Diagnosis not present

## 2022-08-03 DIAGNOSIS — J3089 Other allergic rhinitis: Secondary | ICD-10-CM | POA: Diagnosis not present

## 2022-08-03 DIAGNOSIS — R059 Cough, unspecified: Secondary | ICD-10-CM | POA: Diagnosis not present

## 2022-08-12 ENCOUNTER — Encounter: Payer: Self-pay | Admitting: Gastroenterology

## 2022-09-20 DIAGNOSIS — D225 Melanocytic nevi of trunk: Secondary | ICD-10-CM | POA: Diagnosis not present

## 2022-09-20 DIAGNOSIS — L821 Other seborrheic keratosis: Secondary | ICD-10-CM | POA: Diagnosis not present

## 2022-09-20 DIAGNOSIS — L814 Other melanin hyperpigmentation: Secondary | ICD-10-CM | POA: Diagnosis not present

## 2022-09-20 DIAGNOSIS — Z86018 Personal history of other benign neoplasm: Secondary | ICD-10-CM | POA: Diagnosis not present

## 2022-09-27 ENCOUNTER — Encounter: Payer: Self-pay | Admitting: Physician Assistant

## 2022-09-27 ENCOUNTER — Ambulatory Visit (INDEPENDENT_AMBULATORY_CARE_PROVIDER_SITE_OTHER): Payer: BC Managed Care – PPO | Admitting: Physician Assistant

## 2022-09-27 VITALS — BP 104/62 | HR 58 | Ht 66.0 in | Wt 142.6 lb

## 2022-09-27 DIAGNOSIS — K219 Gastro-esophageal reflux disease without esophagitis: Secondary | ICD-10-CM | POA: Diagnosis not present

## 2022-09-27 DIAGNOSIS — R059 Cough, unspecified: Secondary | ICD-10-CM | POA: Diagnosis not present

## 2022-09-27 DIAGNOSIS — R0989 Other specified symptoms and signs involving the circulatory and respiratory systems: Secondary | ICD-10-CM

## 2022-09-27 DIAGNOSIS — Z800 Family history of malignant neoplasm of digestive organs: Secondary | ICD-10-CM

## 2022-09-27 MED ORDER — NA SULFATE-K SULFATE-MG SULF 17.5-3.13-1.6 GM/177ML PO SOLN: 1.0000 | ORAL | 0 refills | Status: DC

## 2022-09-27 MED ORDER — FAMOTIDINE 20 MG PO TABS: ORAL_TABLET | ORAL | 6 refills | Status: AC

## 2022-09-27 NOTE — Progress Notes (Signed)
Subjective:    Patient ID: Brittney Baird, female    DOB: 02/21/1971, 51 y.o.   MRN: [id number]  HPI Brittney Baird is a pleasant 51 year old white female, established with Dr. Ardis Hughs and last seen here in 2018 when she underwent colonoscopy.  She has family history of colon cancer in her mother diagnosed at age 26. Patient's colonoscopy was normal, and she was indicated for 5-year interval follow-up.  She comes in today to discuss follow-up colonoscopy and also possible EGD with concerns for GERD. She relates that she started having a "phlegmy" cough in early May 2023 which has persisted.  She says that she was having some discolored mucus off and on, no shortness of breath wheezing etc. She is become concerned because of persistence of symptoms.  The cough is now intermittent, nonconstant sometimes seems to be aggravated by lying flat on the she has not paid attention to whether this is related to meals or being postprandial. She did have chest x-ray which was negative and then was seen by Dr. Vanwinkle/allergy as she has history of seasonal allergies.  She had switched her antihistamine to Zyrtec, underwent allergy testing which was negative and it was suggested that her symptoms may be due to acid reflux.  She has not been given a trial of an H2 blocker or PPI.  She tried some Mylanta herself but this caused GI upset. She is not having any regular heartburn, does describe intermittent indigestion and belching, no dysphagia or odynophagia, does have some increase in throat clearing recently. She relates that she has an adjustable bed and has slept with her head elevated for a long time just because she is more comfortable that way, unaware of any nocturnal reflux type symptoms. She is hesitant to take prescription medications because of concerns for side effects that she has read about.  Review of Systems Pertinent positive and negative review of systems were noted in the above HPI section.  All  other review of systems was otherwise negative.   Outpatient Encounter Medications as of 09/27/2022  Medication Sig   cetirizine (ZYRTEC) 10 MG tablet Take 10 mg by mouth daily.   Cholecalciferol 50 MCG (2000 UT) TABS 1 tablet   famotidine (PEPCID) 20 MG tablet Take 1 tablet in the morning before breakfast and in the evening before dinner meal   Flaxseed, Linseed, (FLAX SEED OIL PO) Take 1 tablet by mouth daily.   fluticasone (FLONASE) 50 MCG/ACT nasal spray Place 2 sprays into both nostrils as needed.   Misc Natural Products (GLUCOSAMINE CHOND MSM FORMULA PO) Take 1 tablet by mouth daily.   Multiple Vitamins-Minerals (WOMENS MULTIVITAMIN PO) Take 1 tablet by mouth daily.   Na Sulfate-K Sulfate-Mg Sulf (SUPREP BOWEL PREP KIT) 17.5-3.13-1.6 GM/177ML SOLN Take 1 kit by mouth as directed.   norethindrone-ethinyl estradiol (JUNEL FE,GILDESS FE,LOESTRIN FE) 1-20 MG-MCG tablet Take 1 tablet by mouth.   TURMERIC PO Take 1 tablet by mouth daily.   fexofenadine (ALLEGRA) 180 MG tablet Take 180 mg by mouth. (Patient not taking: Reported on 09/27/2022)   predniSONE (DELTASONE) 10 MG tablet Take 4 tabs for 2 days, then 3 tabs for 2 days, then 2 tabs for 2 days, then 1 tab for 2 days, then stop (Patient not taking: Reported on 09/27/2022)   No facility-administered encounter medications on file as of 09/27/2022.   Allergies  Allergen Reactions   Codeine     nausea   Patient Active Problem List   Diagnosis Date Noted  COVID-19 07/23/2020   Anterior cruciate ligament tear 09/04/2014   Allergic rhinitis 10/30/2009   Social History   Socioeconomic History   Marital status: Married    Spouse name: Not on file   Number of children: 2   Years of education: Not on file   Highest education level: Not on file  Occupational History   Occupation: stay at home mom  Tobacco Use   Smoking status: Never   Smokeless tobacco: Never  Substance and Sexual Activity   Alcohol use: No    Alcohol/week: 0.0  standard drinks of alcohol   Drug use: No   Sexual activity: Not on file  Other Topics Concern   Not on file  Social History Narrative   Not on file   Social Determinants of Health   Financial Resource Strain: Not on file  Food Insecurity: Not on file  Transportation Needs: Not on file  Physical Activity: Not on file  Stress: Not on file  Social Connections: Not on file  Intimate Partner Violence: Not on file    Brittney Baird's family history includes Colon cancer (age of onset: 77) in her mother; Colon polyps in her mother; Diabetes in her mother; Hyperlipidemia in her mother; Irritable bowel syndrome in her mother; Kidney disease in her mother; Melanoma in her father; Ovarian cancer in her paternal grandmother; Prostate cancer in her father.      Objective:    Vitals:   09/27/22 1403  BP: 104/62  Pulse: (!) 58    Physical Exam Well-developed well-nourished  WF  in no acute distress.  Height, Weight, 142 BMI 23  HEENT; nontraumatic normocephalic, EOMI, PE R LA, sclera anicteric. Oropharynx; not examined today Neck; supple, no JVD Cardiovascular; regular rate and rhythm with S1-S2, no murmur rub or gallop Pulmonary; Clear bilaterally Abdomen; soft, nontender, nondistended, no palpable mass or hepatosplenomegaly, bowel sounds are active Rectal; not done Skin; benign exam, no jaundice rash or appreciable lesions Extremities; no clubbing cyanosis or edema skin warm and dry Neuro/Psych; alert and oriented x4, grossly nonfocal mood and affect appropriate        Assessment & Plan:   #21 51 year old white female with family history of colon cancer in her mother.  Patient is due for follow-up colonoscopy. Currently asymptomatic, last colonoscopy October 2018 was normal  #2  37-monthhistory of cough, initially productive, now persistent and intermittent, sometimes aggravated by lying flat, patient unaware of association with meals.  He has had increased throat clearing, no  sense of heartburn but has had occasional indigestion. Negative recent chest x-ray and negative allergy evaluation with exception of mild On Zyrtec without improvement in symptoms  Is not entirely clear that her current symptoms are related to acid reflux, this may be a combination of acid reflux and postnasal drainage, or other unrelated cough  , She has not tried any antireflux medications.  Plan; Patient will be scheduled for colonoscopy and EGD with Dr. CCandis Schatzin Dr. JEugenia Pancoastabsence.  Both procedures were discussed in detail with the patient including indications risks and benefits.  She prefers to proceed with EGD at this time in order to get a more definitive diagnosis. Start antireflux regimen, patient was given an antireflux sheet, and antireflux diet As she is hesitant to use a PPI we will give her a trial of Pepcid 20 mg p.o. twice daily AC, prescription sent. Further recommendations will be pending endoscopic evaluation and response to lower dose H2 blocker trial.  AGlade NurseEsterwood PA-C 09/27/2022  Cc: Fanny Bien, MD

## 2022-09-27 NOTE — Patient Instructions (Signed)
If you are age 52 or younger, your body mass index should be between 19-25. Your Body mass index is 23.01 kg/m. If this is out of the aformentioned range listed, please consider follow up with your Primary Care Provider.  ________________________________________________________  The [facility] providers would like to encourage you to use [facility] to communicate with providers for non-urgent requests or questions.  Due to long hold times on the telephone, sending your provider a message by [facility] may be a faster and more efficient way to get a response.  Please allow 48 business hours for a response.  Please remember that this is for non-urgent requests.  _______________________________________________________  We have sent the following medications to your pharmacy for you to pick up at your convenience:  START: Pepcid 20 mg one tablet shortly before breakfast and dinner meals each day.  You have been scheduled for an endoscopy and colonoscopy. Please follow the written instructions given to you at your visit today. Please pick up your prep supplies at the pharmacy within the next 1-3 days. If you use inhalers (even only as needed), please bring them with you on the day of your procedure.  Due to recent changes in healthcare laws, you may see the results of your imaging and laboratory studies on MyChart before your provider has had a chance to review them.  We understand that in some cases there may be results that are confusing or concerning to you. Not all laboratory results come back in the same time frame and the provider may be waiting for multiple results in order to interpret others.  Please give [place] 48 hours in order for your provider to thoroughly review all the results before contacting the office for clarification of your results.   Thank you for entrusting me with your care and choosing [facility].  Amy Esterwood, PA-C

## 2022-09-28 NOTE — Progress Notes (Signed)
Agree with the assessment and plan as outlined by Amy Esterwood, PA-C.  Tahara Ruffini E. Ellyn Rubiano, MD  

## 2022-10-05 ENCOUNTER — Encounter: Payer: BC Managed Care – PPO | Admitting: Gastroenterology

## 2022-10-19 ENCOUNTER — Telehealth: Payer: Self-pay | Admitting: Physician Assistant

## 2022-10-19 NOTE — Telephone Encounter (Signed)
Patient called would like to know if she can have a different prep. Please advise.

## 2022-10-19 NOTE — Telephone Encounter (Signed)
Returned call to patient. Gave options of other preps. Patient decided that she would keep Suprep.

## 2022-10-25 ENCOUNTER — Encounter: Payer: Self-pay | Admitting: Gastroenterology

## 2022-10-27 ENCOUNTER — Encounter: Payer: Self-pay | Admitting: Gastroenterology

## 2022-10-28 ENCOUNTER — Encounter: Payer: Self-pay | Admitting: Gastroenterology

## 2022-10-28 ENCOUNTER — Ambulatory Visit (AMBULATORY_SURGERY_CENTER): Payer: BC Managed Care – PPO | Admitting: Gastroenterology

## 2022-10-28 ENCOUNTER — Telehealth: Payer: Self-pay

## 2022-10-28 VITALS — BP 130/72 | HR 55 | Temp 97.1°F | Resp 15 | Ht 66.0 in | Wt 142.0 lb

## 2022-10-28 DIAGNOSIS — R053 Chronic cough: Secondary | ICD-10-CM | POA: Diagnosis not present

## 2022-10-28 DIAGNOSIS — Z800 Family history of malignant neoplasm of digestive organs: Secondary | ICD-10-CM | POA: Diagnosis not present

## 2022-10-28 DIAGNOSIS — D123 Benign neoplasm of transverse colon: Secondary | ICD-10-CM

## 2022-10-28 DIAGNOSIS — R0989 Other specified symptoms and signs involving the circulatory and respiratory systems: Secondary | ICD-10-CM

## 2022-10-28 DIAGNOSIS — Z1211 Encounter for screening for malignant neoplasm of colon: Secondary | ICD-10-CM | POA: Diagnosis not present

## 2022-10-28 DIAGNOSIS — K635 Polyp of colon: Secondary | ICD-10-CM

## 2022-10-28 MED ORDER — SODIUM CHLORIDE 0.9 % IV SOLN: 500.0000 mL | Freq: Once | INTRAVENOUS | Status: DC

## 2022-10-28 NOTE — Progress Notes (Unsigned)
History and Physical Interval Note:  10/28/2022 2:41 PM  Brittney Baird  has presented today for endoscopic procedure(s), with the diagnosis of  Encounter Diagnoses  Name Primary?   Family history of colon cancer Yes   Throat clearing   .  The various methods of evaluation and treatment have been discussed with the patient and/or family. After consideration of risks, benefits and other options for treatment, the patient has consented to  the endoscopic procedure(s).   The patient's history has been reviewed, patient examined, no change in status, stable for endoscopic procedure(s).  I have reviewed the patient's chart and labs.  Questions were answered to the patient's satisfaction.     Shenee Wignall E. Candis Schatz, MD [facility]

## 2022-10-28 NOTE — Telephone Encounter (Signed)
Called patient in regards to patient my chart message from 10/27/22.  Encouraged patient to drink 2nd portion of prep slowly with a straw and can also can hold nose to get through the prep.  Patient is going to do the best she can.  I told her if it takes her a little longer to drink the prep that is ok it was more important to keep it down.  Patient verbalized understanding.

## 2022-10-28 NOTE — Op Note (Signed)
[facility] Patient Name: Brittney Baird Procedure Date: 10/28/2022 2:46 PM MRN: [id number] Endoscopist: Nicki Reaper E. Candis Schatz , MD, [id number] Age: 52 Referring MD:  Date of Birth: 1971-04-13 Gender: Female Account #: [id number] Procedure:                Colonoscopy Indications:              Screening in patient at increased risk: Colorectal                            cancer in mother before age 87 Medicines:                Monitored Anesthesia Care Procedure:                Pre-Anesthesia Assessment:                           - Prior to the procedure, a History and Physical                            was performed, and patient medications and                            allergies were reviewed. The patient's tolerance of                            previous anesthesia was also reviewed. The risks                            and benefits of the procedure and the sedation                            options and risks were discussed with the patient.                            All questions were answered, and informed consent                            was obtained. Prior Anticoagulants: The patient has                            taken no anticoagulant or antiplatelet agents. ASA                            Grade Assessment: I - A normal, healthy patient.                            After reviewing the risks and benefits, the patient                            was deemed in satisfactory condition to undergo the                            procedure.  After obtaining informed consent, the colonoscope                            was passed under direct vision. Throughout the                            procedure, the patient's blood pressure, pulse, and                            oxygen saturations were monitored continuously. The                            Olympus CF-HQ190L (Serial# [id number]) Colonoscope was                            introduced through the anus and  advanced to the the                            terminal ileum, with identification of the                            appendiceal orifice and IC valve. The colonoscopy                            was performed without difficulty. The patient                            tolerated the procedure well. The quality of the                            bowel preparation was adequate. The terminal ileum,                            ileocecal valve, appendiceal orifice, and rectum                            were photographed. The bowel preparation used was                            SUPREP via split dose instruction. Scope In: 2:56:36 PM Scope Out: 3:17:37 PM Scope Withdrawal Time: 0 hours 9 minutes 51 seconds  Total Procedure Duration: 0 hours 21 minutes 1 second  Findings:                 The perianal and digital rectal examinations were                            normal. Pertinent negatives include normal                            sphincter tone and no palpable rectal lesions.                           An erythematous 6 mm polyp was found in the  transverse colon. The polyp was flat. The polyp was                            removed with a cold snare. Resection and retrieval                            were complete. Estimated blood loss was minimal.                           The exam was otherwise normal throughout the                            examined colon.                           The terminal ileum appeared normal.                           The retroflexed view of the distal rectum and anal                            verge was normal and showed no anal or rectal                            abnormalities. Complications:            No immediate complications. Estimated Blood Loss:     Estimated blood loss was minimal. Impression:               - One 6 mm polyp in the transverse colon, removed                            with a cold snare. Resected and retrieved.                            - The examined portion of the ileum was normal.                           - The distal rectum and anal verge are normal on                            retroflexion view. Recommendation:           - Patient has a contact number available for                            emergencies. The signs and symptoms of potential                            delayed complications were discussed with the                            patient. Return to normal activities tomorrow.  Written discharge instructions were provided to the                            patient.                           - Resume previous diet.                           - Continue present medications.                           - Await pathology results.                           - Repeat colonoscopy in 5 years due to family                            history of colon cancer. Jaiyon Wander E. Candis Schatz, MD 10/28/2022 3:30:51 PM This report has been signed electronically.

## 2022-10-28 NOTE — Op Note (Signed)
[place] Patient Name: Brittney Baird Procedure Date: 10/28/2022 2:47 PM MRN: [id number] Endoscopist: Nicki Reaper E. Candis Baird , MD, [id number] Age: 52 Referring MD:  Date of Birth: December 13, 1970 Gender: Female Account #: [id number] Procedure:                Upper GI endoscopy Indications:              Chronic cough Medicines:                Monitored Anesthesia Care Procedure:                Pre-Anesthesia Assessment:                           - Prior to the procedure, a History and Physical                            was performed, and patient medications and                            allergies were reviewed. The patient's tolerance of                            previous anesthesia was also reviewed. The risks                            and benefits of the procedure and the sedation                            options and risks were discussed with the patient.                            All questions were answered, and informed consent                            was obtained. Prior Anticoagulants: The patient has                            taken no anticoagulant or antiplatelet agents. ASA                            Grade Assessment: I - A normal, healthy patient.                            After reviewing the risks and benefits, the patient                            was deemed in satisfactory condition to undergo the                            procedure.                           After obtaining informed consent, the endoscope was  passed under direct vision. Throughout the                            procedure, the patient's blood pressure, pulse, and                            oxygen saturations were monitored continuously. The                            GIF [id number] #[id number] was introduced through the                            mouth, and advanced to the second part of duodenum.                            The upper GI endoscopy was accomplished  without                            difficulty. The patient tolerated the procedure                            well. Scope In: Scope Out: Findings:                 The examined portions of the nasopharynx,                            oropharynx and larynx were normal.                           The Z-line was irregular.                           The exam of the esophagus was otherwise normal.                           The gastroesophageal flap valve was visualized                            endoscopically and classified as Hill Grade III                            (minimal fold, loose to endoscope, hiatal hernia                            likely).                           The entire examined stomach was normal.                           The examined duodenum was normal. Complications:            No immediate complications. Estimated Blood Loss:     Estimated blood loss: none. Impression:               - The examined portions of the  nasopharynx,                            oropharynx and larynx were normal.                           - Z-line irregular.                           - Gastroesophageal flap valve classified as Hill                            Grade III (minimal fold, loose to endoscope, hiatal                            hernia likely).                           - Normal stomach.                           - Normal examined duodenum.                           - No specimens collected.                           - Although no esophagitis present, based on her                            valve appearance and irregular Z-line, it is likely                            that the patient's cough symptoms are GERD-related Recommendation:           - Patient has a contact number available for                            emergencies. The signs and symptoms of potential                            delayed complications were discussed with the                            patient. Return to normal  activities tomorrow.                            Written discharge instructions were provided to the                            patient.                           - Resume previous diet.                           - Continue present medications (Pepcid).                           -  Consider GERD-related lifestyle changes                            (avoidance of coffee/carbonated beverages, alcohol,                            tomato-based foods/sauces, spicy/fatty foods;                            elevated head of bed, avoid eating within 3 hours                            of bedtime, etc) Brittney Frede E. Candis Schatz, MD 10/28/2022 3:27:05 PM This report has been signed electronically.

## 2022-10-28 NOTE — Progress Notes (Unsigned)
To pacu, VSS. Report to [email or web address] 

## 2022-10-28 NOTE — Patient Instructions (Addendum)
Information/ Handout given on Polyps, GERD and Hiatal Hernia.  Repeat colonoscopy in 5 years.  YOU HAD AN ENDOSCOPIC PROCEDURE TODAY AT [facility]:   Refer to the procedure report that was given to you for any specific questions about what was found during the examination.  If the procedure report does not answer your questions, please call your gastroenterologist to clarify.  If you requested that your care partner not be given the details of your procedure findings, then the procedure report has been included in a sealed envelope for you to review at your convenience later.  YOU SHOULD EXPECT: Some feelings of bloating in the abdomen. Passage of more gas than usual.  Walking can help get rid of the air that was put into your GI tract during the procedure and reduce the bloating. If you had a lower endoscopy (such as a colonoscopy or flexible sigmoidoscopy) you may notice spotting of blood in your stool or on the toilet paper. If you underwent a bowel prep for your procedure, you may not have a normal bowel movement for a few days.  Please Note:  You might notice some irritation and congestion in your nose or some drainage.  This is from the oxygen used during your procedure.  There is no need for concern and it should clear up in a day or so.  SYMPTOMS TO REPORT IMMEDIATELY: Following lower endoscopy (colonoscopy):  Excessive amounts of blood in the stool  Significant tenderness or worsening of abdominal pains  Swelling of the abdomen that is new, acute  Fever of 100F or higher Following upper endoscopy (EGD)  Vomiting of blood or coffee ground material  New chest pain or pain under the shoulder blades  Painful or persistently difficult swallowing  New shortness of breath  Black, tarry-looking stools  For urgent or emergent issues, a gastroenterologist can be reached at any hour by calling [phone]. Do not use MyChart messaging for urgent concerns.   DIET:  We do  recommend a small meal at first, but then you may proceed to your regular diet.  Drink plenty of fluids but you should avoid alcoholic beverages for 24 hours.  ACTIVITY:  You should plan to take it easy for the rest of today and you should NOT DRIVE or use heavy machinery until tomorrow (because of the sedation medicines used during the test).    FOLLOW UP: Our staff will call the number listed on your records the next business day following your procedure.  We will call around 7:15- 8:00 am to check on you and address any questions or concerns that you may have regarding the information given to you following your procedure. If we do not reach you, we will leave a message.     If any biopsies were taken you will be contacted by phone or by letter within the next 1-3 weeks.  Please call us at [phone] if you have not heard about the biopsies in 3 weeks.    SIGNATURES/CONFIDENTIALITY: You and/or your care partner have signed paperwork which will be entered into your electronic medical record.  These signatures attest to the fact that that the information above on your After Visit Summary has been reviewed and is understood.  Full responsibility of the confidentiality of this discharge information lies with you and/or your care-partner.

## 2022-10-28 NOTE — Progress Notes (Unsigned)
Called to room to assist during endoscopic procedure.  Patient ID and intended procedure confirmed with present staff. Received instructions for my participation in the procedure from the performing physician.  

## 2022-10-31 ENCOUNTER — Telehealth: Payer: Self-pay | Admitting: *Deleted

## 2022-10-31 NOTE — Telephone Encounter (Signed)
  Follow up Call-     10/28/2022    1:49 PM  Call back number  Post procedure Call Back phone  # [phone]  Permission to leave phone message Yes     Patient questions:  Do you have a fever, pain , or abdominal swelling? No. Pain Score  0 *  Have you tolerated food without any problems? Yes.    Have you been able to return to your normal activities? Yes.    Do you have any questions about your discharge instructions: Diet   No. Medications  No. Follow up visit  No.  Do you have questions or concerns about your Care? No.  Actions: * If pain score is 4 or above: No action needed, pain <4.

## 2022-11-02 NOTE — Progress Notes (Signed)
Brittney Baird,  The polyp removed during your recent colonoscopy was not precancerous.  It was likely just focal reactive/inflammatory change.  No further evaluation is recommended.   Plan to repeat colonoscopy in 5 years because of your family history of colon cancer. Follow up as needed in clinic for further evaluation and management of GERD symptoms.

## 2023-02-13 DIAGNOSIS — Z1151 Encounter for screening for human papillomavirus (HPV): Secondary | ICD-10-CM | POA: Diagnosis not present

## 2023-02-13 DIAGNOSIS — Z01419 Encounter for gynecological examination (general) (routine) without abnormal findings: Secondary | ICD-10-CM | POA: Diagnosis not present

## 2023-02-13 DIAGNOSIS — Z124 Encounter for screening for malignant neoplasm of cervix: Secondary | ICD-10-CM | POA: Diagnosis not present

## 2023-02-13 DIAGNOSIS — Z1231 Encounter for screening mammogram for malignant neoplasm of breast: Secondary | ICD-10-CM | POA: Diagnosis not present

## 2023-02-13 DIAGNOSIS — Z6823 Body mass index (BMI) 23.0-23.9, adult: Secondary | ICD-10-CM | POA: Diagnosis not present

## 2023-03-07 DIAGNOSIS — L821 Other seborrheic keratosis: Secondary | ICD-10-CM | POA: Diagnosis not present

## 2023-03-07 DIAGNOSIS — Z86018 Personal history of other benign neoplasm: Secondary | ICD-10-CM | POA: Diagnosis not present

## 2023-03-07 DIAGNOSIS — D225 Melanocytic nevi of trunk: Secondary | ICD-10-CM | POA: Diagnosis not present

## 2023-03-07 DIAGNOSIS — L814 Other melanin hyperpigmentation: Secondary | ICD-10-CM | POA: Diagnosis not present

## 2023-03-20 DIAGNOSIS — J209 Acute bronchitis, unspecified: Secondary | ICD-10-CM | POA: Diagnosis not present

## 2023-03-20 DIAGNOSIS — R059 Cough, unspecified: Secondary | ICD-10-CM | POA: Diagnosis not present

## 2023-03-20 DIAGNOSIS — Z20822 Contact with and (suspected) exposure to covid-19: Secondary | ICD-10-CM | POA: Diagnosis not present

## 2023-04-04 DIAGNOSIS — Z0000 Encounter for general adult medical examination without abnormal findings: Secondary | ICD-10-CM | POA: Diagnosis not present

## 2023-04-04 DIAGNOSIS — Z13220 Encounter for screening for lipoid disorders: Secondary | ICD-10-CM | POA: Diagnosis not present

## 2023-04-04 DIAGNOSIS — Z23 Encounter for immunization: Secondary | ICD-10-CM | POA: Diagnosis not present

## 2023-07-12 DIAGNOSIS — H5213 Myopia, bilateral: Secondary | ICD-10-CM | POA: Diagnosis not present

## 2023-07-12 DIAGNOSIS — H04123 Dry eye syndrome of bilateral lacrimal glands: Secondary | ICD-10-CM | POA: Diagnosis not present

## 2023-07-12 DIAGNOSIS — H25043 Posterior subcapsular polar age-related cataract, bilateral: Secondary | ICD-10-CM | POA: Diagnosis not present

## 2023-07-12 DIAGNOSIS — H1789 Other corneal scars and opacities: Secondary | ICD-10-CM | POA: Diagnosis not present

## 2023-09-12 DIAGNOSIS — L814 Other melanin hyperpigmentation: Secondary | ICD-10-CM | POA: Diagnosis not present

## 2023-09-12 DIAGNOSIS — D225 Melanocytic nevi of trunk: Secondary | ICD-10-CM | POA: Diagnosis not present

## 2023-09-12 DIAGNOSIS — L821 Other seborrheic keratosis: Secondary | ICD-10-CM | POA: Diagnosis not present

## 2023-09-12 DIAGNOSIS — Z86018 Personal history of other benign neoplasm: Secondary | ICD-10-CM | POA: Diagnosis not present

## 2023-12-09 DIAGNOSIS — S93492A Sprain of other ligament of left ankle, initial encounter: Secondary | ICD-10-CM | POA: Diagnosis not present

## 2023-12-12 ENCOUNTER — Other Ambulatory Visit: Payer: Self-pay | Admitting: Family

## 2023-12-12 DIAGNOSIS — N6320 Unspecified lump in the left breast, unspecified quadrant: Secondary | ICD-10-CM

## 2023-12-20 ENCOUNTER — Other Ambulatory Visit: Payer: Self-pay | Admitting: Family

## 2023-12-20 ENCOUNTER — Ambulatory Visit
Admission: RE | Admit: 2023-12-20 | Discharge: 2023-12-20 | Disposition: A | Discharge status: Home / Self Care | Payer: BC Managed Care – PPO | Source: Ambulatory Visit | Attending: Family | Admitting: Family

## 2023-12-20 DIAGNOSIS — N6320 Unspecified lump in the left breast, unspecified quadrant: Secondary | ICD-10-CM

## 2023-12-20 DIAGNOSIS — N6324 Unspecified lump in the left breast, lower inner quadrant: Secondary | ICD-10-CM | POA: Diagnosis not present

## 2023-12-20 DIAGNOSIS — N6321 Unspecified lump in the left breast, upper outer quadrant: Secondary | ICD-10-CM | POA: Diagnosis not present

## 2023-12-21 ENCOUNTER — Other Ambulatory Visit: Payer: BC Managed Care – PPO

## 2023-12-22 ENCOUNTER — Other Ambulatory Visit: Payer: BC Managed Care – PPO

## 2023-12-26 ENCOUNTER — Ambulatory Visit
Admission: RE | Admit: 2023-12-26 | Discharge: 2023-12-26 | Disposition: A | Discharge status: Home / Self Care | Payer: BC Managed Care – PPO | Source: Ambulatory Visit | Attending: Family | Admitting: Family

## 2023-12-26 ENCOUNTER — Ambulatory Visit
Admission: RE | Admit: 2023-12-26 | Discharge: 2023-12-26 | Disposition: A | Discharge status: Home / Self Care | Source: Ambulatory Visit | Attending: Family | Admitting: Family

## 2023-12-26 DIAGNOSIS — N6320 Unspecified lump in the left breast, unspecified quadrant: Secondary | ICD-10-CM

## 2023-12-26 DIAGNOSIS — N6022 Fibroadenosis of left breast: Secondary | ICD-10-CM | POA: Diagnosis not present

## 2023-12-26 DIAGNOSIS — N6324 Unspecified lump in the left breast, lower inner quadrant: Secondary | ICD-10-CM | POA: Diagnosis not present

## 2023-12-26 DIAGNOSIS — R92332 Mammographic heterogeneous density, left breast: Secondary | ICD-10-CM | POA: Diagnosis not present

## 2023-12-26 HISTORY — PX: BREAST BIOPSY: SHX20

## 2023-12-27 LAB — SURGICAL PATHOLOGY

## 2024-02-14 DIAGNOSIS — Z1231 Encounter for screening mammogram for malignant neoplasm of breast: Secondary | ICD-10-CM | POA: Diagnosis not present

## 2024-02-14 DIAGNOSIS — Z6823 Body mass index (BMI) 23.0-23.9, adult: Secondary | ICD-10-CM | POA: Diagnosis not present

## 2024-02-14 DIAGNOSIS — Z1151 Encounter for screening for human papillomavirus (HPV): Secondary | ICD-10-CM | POA: Diagnosis not present

## 2024-02-14 DIAGNOSIS — Z124 Encounter for screening for malignant neoplasm of cervix: Secondary | ICD-10-CM | POA: Diagnosis not present

## 2024-02-14 DIAGNOSIS — Z01419 Encounter for gynecological examination (general) (routine) without abnormal findings: Secondary | ICD-10-CM | POA: Diagnosis not present

## 2024-02-14 DIAGNOSIS — R87810 Cervical high risk human papillomavirus (HPV) DNA test positive: Secondary | ICD-10-CM | POA: Diagnosis not present

## 2024-02-16 ENCOUNTER — Other Ambulatory Visit: Payer: Self-pay | Admitting: Obstetrics and Gynecology

## 2024-02-16 DIAGNOSIS — R92333 Mammographic heterogeneous density, bilateral breasts: Secondary | ICD-10-CM

## 2024-03-05 DIAGNOSIS — C44612 Basal cell carcinoma of skin of right upper limb, including shoulder: Secondary | ICD-10-CM | POA: Diagnosis not present

## 2024-03-05 DIAGNOSIS — L821 Other seborrheic keratosis: Secondary | ICD-10-CM | POA: Diagnosis not present

## 2024-03-05 DIAGNOSIS — L814 Other melanin hyperpigmentation: Secondary | ICD-10-CM | POA: Diagnosis not present

## 2024-03-05 DIAGNOSIS — D485 Neoplasm of uncertain behavior of skin: Secondary | ICD-10-CM | POA: Diagnosis not present

## 2024-03-05 DIAGNOSIS — D225 Melanocytic nevi of trunk: Secondary | ICD-10-CM | POA: Diagnosis not present

## 2024-03-05 DIAGNOSIS — Z86018 Personal history of other benign neoplasm: Secondary | ICD-10-CM | POA: Diagnosis not present

## 2024-03-21 DIAGNOSIS — C44612 Basal cell carcinoma of skin of right upper limb, including shoulder: Secondary | ICD-10-CM | POA: Diagnosis not present

## 2024-06-14 DIAGNOSIS — Z13220 Encounter for screening for lipoid disorders: Secondary | ICD-10-CM | POA: Diagnosis not present

## 2024-06-14 DIAGNOSIS — Z1329 Encounter for screening for other suspected endocrine disorder: Secondary | ICD-10-CM | POA: Diagnosis not present

## 2024-06-14 DIAGNOSIS — Z0000 Encounter for general adult medical examination without abnormal findings: Secondary | ICD-10-CM | POA: Diagnosis not present

## 2024-07-02 DIAGNOSIS — D225 Melanocytic nevi of trunk: Secondary | ICD-10-CM | POA: Diagnosis not present

## 2024-07-02 DIAGNOSIS — D485 Neoplasm of uncertain behavior of skin: Secondary | ICD-10-CM | POA: Diagnosis not present

## 2024-07-17 DIAGNOSIS — N951 Menopausal and female climacteric states: Secondary | ICD-10-CM | POA: Diagnosis not present

## 2024-08-05 ENCOUNTER — Other Ambulatory Visit

## 2024-09-04 DIAGNOSIS — D2261 Melanocytic nevi of right upper limb, including shoulder: Secondary | ICD-10-CM | POA: Diagnosis not present

## 2024-09-04 DIAGNOSIS — D2361 Other benign neoplasm of skin of right upper limb, including shoulder: Secondary | ICD-10-CM | POA: Diagnosis not present

## 2024-09-12 DIAGNOSIS — R635 Abnormal weight gain: Secondary | ICD-10-CM | POA: Diagnosis not present

## 2024-09-12 DIAGNOSIS — N951 Menopausal and female climacteric states: Secondary | ICD-10-CM | POA: Diagnosis not present

## 2024-09-12 DIAGNOSIS — L659 Nonscarring hair loss, unspecified: Secondary | ICD-10-CM | POA: Diagnosis not present

## 2024-09-12 DIAGNOSIS — R5383 Other fatigue: Secondary | ICD-10-CM | POA: Diagnosis not present

## 2024-09-27 DIAGNOSIS — H04123 Dry eye syndrome of bilateral lacrimal glands: Secondary | ICD-10-CM | POA: Diagnosis not present

## 2024-09-27 DIAGNOSIS — H1789 Other corneal scars and opacities: Secondary | ICD-10-CM | POA: Diagnosis not present

## 2024-09-27 DIAGNOSIS — H01005 Unspecified blepharitis left lower eyelid: Secondary | ICD-10-CM | POA: Diagnosis not present

## 2024-09-27 DIAGNOSIS — H5213 Myopia, bilateral: Secondary | ICD-10-CM | POA: Diagnosis not present

## 2024-09-27 DIAGNOSIS — H01002 Unspecified blepharitis right lower eyelid: Secondary | ICD-10-CM | POA: Diagnosis not present

## 2024-10-18 DIAGNOSIS — Z20828 Contact with and (suspected) exposure to other viral communicable diseases: Secondary | ICD-10-CM | POA: Diagnosis not present

## 2024-10-21 DIAGNOSIS — J40 Bronchitis, not specified as acute or chronic: Secondary | ICD-10-CM | POA: Diagnosis not present

## 2024-10-21 DIAGNOSIS — J029 Acute pharyngitis, unspecified: Secondary | ICD-10-CM | POA: Diagnosis not present
